# Patient Record
Sex: Female | Born: 1950 | Race: White | Hispanic: No | Marital: Married | Smoking: Never smoker
Health system: Southern US, Community
[De-identification: ages and names within clinical notes are randomized; demographics above are authoritative.]

## PROBLEM LIST (undated history)

## (undated) DIAGNOSIS — Z789 Other specified health status: Secondary | ICD-10-CM

## (undated) DIAGNOSIS — K219 Gastro-esophageal reflux disease without esophagitis: Secondary | ICD-10-CM

## (undated) DIAGNOSIS — H04123 Dry eye syndrome of bilateral lacrimal glands: Secondary | ICD-10-CM

## (undated) DIAGNOSIS — M1990 Unspecified osteoarthritis, unspecified site: Secondary | ICD-10-CM

## (undated) DIAGNOSIS — E785 Hyperlipidemia, unspecified: Secondary | ICD-10-CM

## (undated) DIAGNOSIS — B009 Herpesviral infection, unspecified: Secondary | ICD-10-CM

## (undated) DIAGNOSIS — I10 Essential (primary) hypertension: Secondary | ICD-10-CM

## (undated) DIAGNOSIS — Z8719 Personal history of other diseases of the digestive system: Secondary | ICD-10-CM

## (undated) HISTORY — DX: Essential (primary) hypertension: I10

## (undated) HISTORY — DX: Hyperlipidemia, unspecified: E78.5

---

## 1998-06-04 ENCOUNTER — Other Ambulatory Visit: Admission: RE | Admit: 1998-06-04 | Discharge: 1998-06-04 | Payer: Self-pay | Admitting: Obstetrics and Gynecology

## 2002-12-14 ENCOUNTER — Other Ambulatory Visit: Admission: RE | Admit: 2002-12-14 | Discharge: 2002-12-14 | Payer: Self-pay | Admitting: Gynecology

## 2002-12-31 ENCOUNTER — Encounter: Payer: Self-pay | Admitting: Emergency Medicine

## 2002-12-31 ENCOUNTER — Inpatient Hospital Stay (HOSPITAL_COMMUNITY): Admission: EM | Admit: 2002-12-31 | Discharge: 2003-01-01 | Payer: Self-pay | Admitting: *Deleted

## 2003-03-08 HISTORY — PX: HYSTEROSCOPY WITH D & C: SHX1775

## 2003-10-23 ENCOUNTER — Encounter (INDEPENDENT_AMBULATORY_CARE_PROVIDER_SITE_OTHER): Payer: Self-pay

## 2003-10-23 HISTORY — PX: OTHER SURGICAL HISTORY: SHX169

## 2003-10-24 ENCOUNTER — Observation Stay (HOSPITAL_COMMUNITY): Admission: AD | Admit: 2003-10-24 | Discharge: 2003-10-25 | Payer: Self-pay | Admitting: Gynecology

## 2003-12-08 HISTORY — PX: CHOLECYSTECTOMY: SHX55

## 2004-01-09 ENCOUNTER — Other Ambulatory Visit: Admission: RE | Admit: 2004-01-09 | Discharge: 2004-01-09 | Payer: Self-pay | Admitting: Gynecology

## 2004-09-19 ENCOUNTER — Encounter: Admission: RE | Admit: 2004-09-19 | Discharge: 2004-09-19 | Payer: Self-pay | Admitting: Gastroenterology

## 2005-01-16 ENCOUNTER — Other Ambulatory Visit: Admission: RE | Admit: 2005-01-16 | Discharge: 2005-01-16 | Payer: Self-pay | Admitting: Gynecology

## 2005-03-09 ENCOUNTER — Ambulatory Visit (HOSPITAL_COMMUNITY): Admission: RE | Admit: 2005-03-09 | Discharge: 2005-03-09 | Payer: Self-pay | Admitting: Gastroenterology

## 2005-08-29 ENCOUNTER — Encounter: Admission: RE | Admit: 2005-08-29 | Discharge: 2005-08-29 | Payer: Self-pay | Admitting: Orthopedic Surgery

## 2006-01-19 ENCOUNTER — Other Ambulatory Visit: Admission: RE | Admit: 2006-01-19 | Discharge: 2006-01-19 | Payer: Self-pay | Admitting: Gynecology

## 2006-03-23 ENCOUNTER — Encounter (INDEPENDENT_AMBULATORY_CARE_PROVIDER_SITE_OTHER): Payer: Self-pay | Admitting: Specialist

## 2006-03-23 ENCOUNTER — Ambulatory Visit (HOSPITAL_COMMUNITY): Admission: RE | Admit: 2006-03-23 | Discharge: 2006-03-24 | Payer: Self-pay | Admitting: Gynecology

## 2006-03-23 HISTORY — PX: VAGINAL HYSTERECTOMY: SUR661

## 2007-10-14 ENCOUNTER — Other Ambulatory Visit: Admission: RE | Admit: 2007-10-14 | Discharge: 2007-10-14 | Payer: Self-pay | Admitting: Gynecology

## 2008-12-19 ENCOUNTER — Ambulatory Visit: Payer: Self-pay | Admitting: Gynecology

## 2008-12-19 ENCOUNTER — Other Ambulatory Visit: Admission: RE | Admit: 2008-12-19 | Discharge: 2008-12-19 | Payer: Self-pay | Admitting: Gynecology

## 2008-12-19 ENCOUNTER — Encounter: Payer: Self-pay | Admitting: Gynecology

## 2009-07-23 ENCOUNTER — Inpatient Hospital Stay (HOSPITAL_COMMUNITY): Admission: RE | Admit: 2009-07-23 | Discharge: 2009-07-25 | Payer: Self-pay | Admitting: Orthopedic Surgery

## 2009-07-23 HISTORY — PX: TOTAL HIP ARTHROPLASTY: SHX124

## 2010-02-14 ENCOUNTER — Ambulatory Visit: Payer: Self-pay | Admitting: Gynecology

## 2010-02-14 ENCOUNTER — Other Ambulatory Visit: Admission: RE | Admit: 2010-02-14 | Discharge: 2010-02-14 | Payer: Self-pay | Admitting: Gynecology

## 2010-12-29 ENCOUNTER — Other Ambulatory Visit: Payer: Self-pay | Admitting: Gastroenterology

## 2011-03-12 ENCOUNTER — Other Ambulatory Visit: Payer: Self-pay | Admitting: Dermatology

## 2011-03-14 LAB — TYPE AND SCREEN
ABO/RH(D): A POS
Antibody Screen: NEGATIVE

## 2011-03-14 LAB — BASIC METABOLIC PANEL
BUN: 8 mg/dL (ref 6–23)
BUN: 8 mg/dL (ref 6–23)
CO2: 28 mEq/L (ref 19–32)
CO2: 29 mEq/L (ref 19–32)
Calcium: 8.2 mg/dL — ABNORMAL LOW (ref 8.4–10.5)
Calcium: 8.7 mg/dL (ref 8.4–10.5)
Chloride: 100 mEq/L (ref 96–112)
Chloride: 103 mEq/L (ref 96–112)
Creatinine, Ser: 0.68 mg/dL (ref 0.4–1.2)
Creatinine, Ser: 0.76 mg/dL (ref 0.4–1.2)
GFR calc Af Amer: >60 mL/min (ref >60)
GFR calc Af Amer: >60 mL/min (ref >60)
GFR calc non Af Amer: >60 mL/min (ref >60)
GFR calc non Af Amer: >60 mL/min (ref >60)
Glucose, Bld: 105 mg/dL — ABNORMAL HIGH (ref 70–99)
Glucose, Bld: 198 mg/dL — ABNORMAL HIGH (ref 70–99)
Potassium: 3.5 mEq/L (ref 3.5–5.1)
Potassium: 3.7 mEq/L (ref 3.5–5.1)
Sodium: 136 mEq/L (ref 135–145)
Sodium: 139 mEq/L (ref 135–145)

## 2011-03-14 LAB — CBC
HCT: 28 % — ABNORMAL LOW (ref 36.0–46.0)
HCT: 30.9 % — ABNORMAL LOW (ref 36.0–46.0)
Hemoglobin: 10.6 g/dL — ABNORMAL LOW (ref 12.0–15.0)
Hemoglobin: 9.9 g/dL — ABNORMAL LOW (ref 12.0–15.0)
MCHC: 34.2 g/dL (ref 30.0–36.0)
MCHC: 35.1 g/dL (ref 30.0–36.0)
MCV: 91.2 fL (ref 78.0–100.0)
MCV: 91.9 fL (ref 78.0–100.0)
Platelets: 163 10*3/uL (ref 150–400)
Platelets: 224 10*3/uL (ref 150–400)
RBC: 3.07 MIL/uL — ABNORMAL LOW (ref 3.87–5.11)
RBC: 3.36 MIL/uL — ABNORMAL LOW (ref 3.87–5.11)
RDW: 12.7 % (ref 11.5–15.5)
RDW: 13.2 % (ref 11.5–15.5)
WBC: 10 10*3/uL (ref 4.0–10.5)
WBC: 6.5 10*3/uL (ref 4.0–10.5)

## 2011-03-14 LAB — ABO/RH: ABO/RH(D): A POS

## 2011-03-15 LAB — DIFFERENTIAL
Basophils Absolute: 0 10*3/uL (ref 0.0–0.1)
Basophils Relative: 1 % (ref 0–1)
Eosinophils Absolute: 0.2 10*3/uL (ref 0.0–0.7)
Eosinophils Relative: 4 % (ref 0–5)
Lymphocytes Relative: 25 % (ref 12–46)
Lymphs Abs: 1.4 10*3/uL (ref 0.7–4.0)
Monocytes Absolute: 0.4 10*3/uL (ref 0.1–1.0)
Monocytes Relative: 7 % (ref 3–12)
Neutro Abs: 3.6 10*3/uL (ref 1.7–7.7)
Neutrophils Relative %: 64 % (ref 43–77)

## 2011-03-15 LAB — BASIC METABOLIC PANEL
BUN: 14 mg/dL (ref 6–23)
CO2: 32 mEq/L (ref 19–32)
Calcium: 9.5 mg/dL (ref 8.4–10.5)
Chloride: 101 mEq/L (ref 96–112)
Creatinine, Ser: 0.73 mg/dL (ref 0.4–1.2)
GFR calc Af Amer: >60 mL/min (ref >60)
GFR calc non Af Amer: >60 mL/min (ref >60)
Glucose, Bld: 69 mg/dL — ABNORMAL LOW (ref 70–99)
Potassium: 3.2 mEq/L — ABNORMAL LOW (ref 3.5–5.1)
Sodium: 140 mEq/L (ref 135–145)

## 2011-03-15 LAB — URINALYSIS, ROUTINE W REFLEX MICROSCOPIC
Bilirubin Urine: NEGATIVE
Glucose, UA: NEGATIVE mg/dL
Hgb urine dipstick: NEGATIVE
Ketones, ur: NEGATIVE mg/dL
Nitrite: NEGATIVE
Protein, ur: NEGATIVE mg/dL
Specific Gravity, Urine: 1.019 (ref 1.005–1.030)
Urobilinogen, UA: 0.2 mg/dL (ref 0.0–1.0)
pH: 6.5 (ref 5.0–8.0)

## 2011-03-15 LAB — CBC
HCT: 40.8 % (ref 36.0–46.0)
Hemoglobin: 13.9 g/dL (ref 12.0–15.0)
MCHC: 34.1 g/dL (ref 30.0–36.0)
MCV: 92.6 fL (ref 78.0–100.0)
Platelets: 249 10*3/uL (ref 150–400)
RBC: 4.4 MIL/uL (ref 3.87–5.11)
RDW: 12.6 % (ref 11.5–15.5)
WBC: 5.7 10*3/uL (ref 4.0–10.5)

## 2011-03-15 LAB — PROTIME-INR
INR: 1.1 (ref 0.00–1.49)
Prothrombin Time: 13.6 seconds (ref 11.6–15.2)

## 2011-03-15 LAB — APTT: aPTT: 29 seconds (ref 24–37)

## 2011-04-21 NOTE — Op Note (Signed)
[name], [name]                ACCOUNT NO.:  [id number]   MEDICAL RECORD NO.:  [id number]          PATIENT TYPE:  INP   LOCATION:  0007                         FACILITY:  [facility]   PHYSICIAN:  Madlyn Frankel. Charlann Boxer, M.D.  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   PROCEDURE:  Right total hip replacement.   COMPONENTS USED:  DePuy hip system size 48 Pinnacle cup and a single  cancellous screw, a  32 + 4 Marathon liner neutral and a two high Tri-  Lock stem with a 32 +5 Delta ceramic ball.   SURGEON:  Madlyn Frankel. Charlann Boxer, M.D.   ASSISTANT:  Yetta Glassman. Mann, PA   ANESTHESIA:  General.   BLOOD LOSS:  Approximately 350 to 400 mL.   DRAINS:  One Hemovac.   COMPLICATIONS:  None.   SPECIMEN:  None.   INDICATIONS:  Ms. Blackson is a 60 year old female who presented to the  office for evaluation of some hip pain, right greater than left.  Radiographs revealed mild dysplastic appearance with advanced bone-on-  bone arthritis.  Conservative measures were discussed but were not  going to provide any long-term relief so she wished to proceed with  arthroplasty.  We discussed the risks of infection, DVT, component  failure, dislocation, need for revision surgery  including dislocation.  The risks were discussed and reviewed.  Consent was obtained for the  benefit of pain relief.   PROCEDURE IN DETAIL:  The patient was brought to operative theater.  Once adequate anesthesia, preoperative antibiotics and Ancef  administered, the patient was positioned to the left lateral decubitus  position with the right-side up.  The right lower extremity was  prescrubbed, prepped and  draped in sterile fashion.  Time-out was  performed identifying the patient, planned procedure and extremity.  A  lateral based incision was made for posterior approach to the hip.  The  iliotibial band and gluteal fascia were incised posteriorly.  The short  external rotators were identified, taken down separate from the  posterior capsule and L capsulotomy was made at the border of the  gluteus minimus.   The hip was dislocated and neck osteotomy made based off anatomic  landmarks.   Severe degenerative changes noted at the femoral head.  Attention was  first directed to the femur.  Femoral canal was opened with a starting  drill and then hand reamed once to prevent fat emboli following  irrigation.  I began broaching with a starting broach and then broached  up to a size 1 which sat at the level of neck cut.  I packed off the  femur at this point and then attended to the acetabulum.  Following  labral debridement I reamed with a 42 reamer down to the medial wall and  then reamed up sequentially up to 47.  I stopped at that point because I  felt that we were thinning out the posterior wall,  I had adequate depth  and coverage and did not want to ream further.  A 48 Pinnacle cup was  chosen.  It was impacted and sat at approximately 35 degrees with 40  degrees of abduction and appeared to be anatomically positioned in  comparison to the transverse acetabular ligament and the anterior rim.  I did place a single cancellous screw into probably anterior cortical  ileum region with good purchase to support the scratch fit.  Trial  reduction was now carried out with a 32 +4 neutral liner.  When I placed  one broach down at the level of neck cut with a standard neck, there was  a lot of shucks.  I went ahead and removed the 1 and went up to a size 2  broach which sat proud to my neck cut and then trialed first a standard  and then high offset neck.  The high offset neck gave me  greater  sense  of stability with the hip.  The iliotibial band tension did not appear  to be excessive.   Given these findings I went ahead and removed all trial components.  The  32 +4 neutral Marathon liner was then impacted.  It sat flush with the  rim of the cup.   The final 2 high Tri-Lock stem was then impacted.  It sat about the  level where the broach was.  Given these parameters I retrialed with a  32 +1.5 and felt there was about  3 to 4 mm shuck and so I chose a 32 +5  ball.  With this during  my trials, there was only about a millimeter  shuck and  so I chose this as my final option.   The final 32 +5 Delta ceramic ball was impacted on the  clean and dry  trunnion and the hip was reduced.   The hip was irrigated throughout the case.  Again at this point I  reapproximated posterior capsular tissue to the superior leaflet using  #1 Vicryl.  The iliotibial band and gluteal fascia were then  reapproximated over top of a medium Hemovac drain.  The remainder of the  wound was closed with 2-0 Vicryl and running 4-0 Monocryl.  The hip  was  cleaned, dried and dressed sterilely with Steri-Strips and a Mepilex  dressing.  She was extubated and brought to the recovery room in stable  condition.      Madlyn Frankel Charlann Boxer, M.D.  Electronically Signed     MDO/MEDQ  D:  07/23/2009  T:  07/23/2009  Job:  [id number]

## 2011-04-21 NOTE — H&P (Signed)
NAMERENU, ASBY                ACCOUNT NO.:  [id number]   MEDICAL RECORD NO.:  [id number]          PATIENT TYPE:  INP   LOCATION:  NA                           FACILITY:  [facility]   PHYSICIAN:  Madlyn Frankel. Charlann Boxer, M.D.  DATE OF BIRTH:  [birth year]   DATE OF ADMISSION:  07/23/2009  DATE OF DISCHARGE:                              HISTORY & PHYSICAL   PROCEDURE:  A right total hip replacement.   CHIEF COMPLAINTS:  Right hip pain.   HISTORY OF PRESENT ILLNESS:  This is a 60 year old female with a history  of right hip pain secondary to osteoarthritis.  It has been refractory  to all conservative treatment.   PRIMARY CARE PHYSICIAN:  Pam Drown, M.D.   PAST MEDICAL HISTORY:  1. Osteoarthritis.  2. Hypertension.  3. Hypercholesterolemia.  4. Migraines.  5. Degenerative disk disease.   FAMILY HISTORY:  None.   SOCIAL HISTORY:  Married, nonsmoker.  Primary caregiver will be husband  in the home postoperatively.   DRUG ALLERGIES:  NO KNOWN DRUG ALLERGIES.   MEDICATIONS:  1. Metoprolol 100 mg p.o. daily.  2. Hydrochlorothiazide 12.5 mg p.o. daily.  3. Lipitor 20 mg p.o. daily.  4. Pantoprazole 40 mg p.o. daily.  5. Lunesta p.r.n.  6. Valtrex p.r.n.  7. Darvocet p.r.n.  8. Maxalt p.r.n.  9. Over-the-counter calcium 1000 mg p.o. daily.  10.Fish oil 1200 mg p.o. daily.  11.Multivitamin daily.  12.Ibuprofen p.r.n.Marland Kitchen   PAST SURGICAL HISTORY:  1. Bladder tuck in 1980.  2. Thyroid surgery in 2004.  3. Gallbladder surgery in 2005.  4. Hysterectomy in 2007.  5. SI injections.   REVIEW OF SYSTEMS:  GENITOURINARY:  She has increased urinary frequency.  MUSCULOSKELETAL:  Joint pain.  Otherwise, see HPI.   PHYSICAL EXAMINATION:  Pulse 72, respirations 16, blood pressure 174/96.  GENERAL:  Awake, alert and oriented.  HEENT:  Normocephalic.  NECK:  Supple.  No carotid bruits.  CHEST/LUNGS:  Clear to auscultation bilaterally.  BREASTS:  Deferred.  HEART: S1 and S2 distinct.  ABDOMEN:  Soft, nontender, bowel sounds present.  PELVIS:  Stable.  GENITOURINARY:  Deferred.  EXTREMITIES:  Right hip has decreased range of motion and pain with  weightbearing.  SKIN:  No cellulitis.  NEUROLOGIC:  Intact distal sensibilities.   LABORATORY DATA:  Labs, EKG, chest x-ray all pending presurgical  testing.   IMPRESSION:  Right hip osteoarthritis.   PLAN OF ACTION:  Right total hip replacement by Madlyn Frankel. Charlann Boxer, M.D. at  [facility] on July 23, 2009.  The risks and complications were  discussed.   Postoperative medication will be provided including aspirin for DVT  prophylaxis.     ______________________________  Yetta Glassman [address], [place]      Madlyn Frankel. Charlann Boxer, M.D.  Electronically Signed    BLM/MEDQ  D:  07/10/2009  T:  07/10/2009  Job:  [id number]   cc:   Pam Drown, M.D.  Fax: [phone]   Callie Fielding, M.D.  Fax: [phone]

## 2011-04-24 NOTE — Op Note (Signed)
NAME:  [name], [name]                          ACCOUNT NO.:  [id number]   MEDICAL RECORD NO.:  [id number]                   PATIENT TYPE:  AMB   LOCATION:  DFTL                                 FACILITY:  [facility]   PHYSICIAN:  Timothy P. Fontaine, M.D.           DATE OF BIRTH:  1951-05-11   DATE OF PROCEDURE:  10/23/2003  DATE OF DISCHARGE:                                 OPERATIVE REPORT   PREOPERATIVE DIAGNOSIS:  Hemoperitoneum, unknown etiology.   POSTOPERATIVE DIAGNOSES:  1. Ruptured leiomyomata.  2. Endometriosis.   PROCEDURES:  1. Diagnostic laparoscopy.  2. Exploratory laparotomy.  3. Abdominal myomectomy.  4. Fulguration of endometriosis.   SURGEON:  Timothy P. Fontaine, M.D.   ASSISTANT:  Scrub technician.   ANESTHESIA:  General endotracheal.   ESTIMATED BLOOD LOSS:  Hemoperitoneum at 1500-2000 mL, 50 mL operative loss.   COMPLICATIONS:  None.   FINDINGS:  A pedunculated 5 cm leiomyoma, fundal, with blown-out ruptured  area actively oozing and bleeding.  There were approximately 2000 mL of  hemoperitoneum filling the pelvis in a congealed mass as well as underneath  the right hemidiaphragm above the liver.  Uterus otherwise grossly normal to  inspection.  Anterior, posterior cul-de-sacs grossly normal, noting  hemoperitoneum limiting exam for small areas of endometriosis.  Left ovary  grossly normal, adherent to the left pelvic sidewall.  Left tube grossly  normal length and caliber, fimbriated end.  Right fallopian tube grossly  normal length and caliber, fimbriated end.  Right ovary with a classic  powder burn-type lesion, which was fulgurated.  Appendix grossly normal,  although distal portion buried and not visualized, no evidence of  inflammatory process.  Liver smooth, no abnormalities.  Gallbladder grossly  normal.   DESCRIPTION OF PROCEDURE:  The patient was taken to the operating room and  underwent general endotracheal anesthesia, was placed in the low  dorsal  lithotomy position, and received an abdominal, perineal, and vaginal  preparation with Hibiclens due to her iodine allergy.  A Foley catheter was  placed, EUA performed, subsequent Hulka tenaculum placed on the cervix.  The  patient was draped in the usual fashion.  A vertical infraumbilical incision  was made, the Veress needle placed, its position verified with water, and 2  L of carbon dioxide gas infused without difficulty.  The 10 mm laparoscopic  trocar was then placed without difficulty, its position verified visually.  A right of midline suprapubic 5 mm port was then placed under direct  visualization after transillumination for the vessels.  Examination of the  pelvis was then carried out, noting a large hemoperitoneum.  The congealed  blood covering the uterus and leiomyomata was evacuated and inspection of  the leiomyomata revealed an area on the superior aspect that was oozing and  after irrigation and better visualization, there was a blown-out portion  that was noted to be bleeding.  At this point, given the size  of the  leiomyomata and the active bleeding, the decision was made to proceed with a  laparotomy.  The instruments were removed.  A 0 Vicryl interrupted  subcutaneous fascial stitch was placed infraumbilically and the  infraumbilical port was closed using Dermabond skin adhesive.  The abdomen  was then sharply entered through a mini-Pfannenstiel incision achieving  adequate hemostasis at all levels.  The hemoperitoneum in the pelvis was  evacuated, the uterus elevated through the incision, and the active bleeding  was tamponaded.  A 0 Vicryl pursestring tie was then placed around the  pedunculated spot of the leiomyoma and tied snugly.  The leiomyoma was then  incised with electrocautery.  Two figure-of-eight interrupted sutures were  subsequently placed across the amputated stalk to assure secure hemostasis.  The pelvis was then irrigated and evacuated and  although due to the  hemoperitoneum and blood-staining, complete visualization was somewhat  limited.  Both adnexa were inspected and found to be hemostatic.  The right  ovary was noted to have a classic endometriotic powder burn-type lesion,  which was fulgurated.  The anterior incision was then irrigated.  Adequate  hemostasis was visualized, and the fascia was reapproximated using 0 Vicryl  in a running suture.  The subcutaneous tissues were irrigated and  electrocautery was used for hemostasis.  The 4-0 Vicryl running subcuticular  stitch was then used to close the skin and Steri-Strips with Benzoin  applied.  A sterile dressing was applied and the patient was taken to the  recovery room in gastric outlet obstruction, having tolerated the procedure  well.                                               Timothy P. Audie Box, M.D.    TPF/MEDQ  D:  10/23/2003  T:  10/24/2003  Job:  [id number]

## 2011-04-24 NOTE — Op Note (Signed)
[name], [name]                ACCOUNT NO.:  [id number]   MEDICAL RECORD NO.:  [id number]          PATIENT TYPE:  AMB   LOCATION:  SDC                           FACILITY:  [facility]   PHYSICIAN:  Timothy P. Fontaine, M.D.DATE OF BIRTH:  07/04/1951   DATE OF PROCEDURE:  03/23/2006  DATE OF DISCHARGE:                                 OPERATIVE REPORT   PREOPERATIVE DIAGNOSES:  Uterine prolapse, rectocele   POSTOPERATIVE DIAGNOSES:  Uterine prolapse, rectocele   PROCEDURES:  1.  Total vaginal hysterectomy.  2.  Bilateral salpingo-oophorectomy posterior colporrhaphies McCall      culdoplasty.   SURGEON:  Timothy P. Fontaine, M.D.   ASSISTANT:  Ivor Costa. Farrel Gobble, M.D.   ANESTHETIC:  General.   ESTIMATED BLOOD LOSS:  100 mL.   SPECIMEN:  Uterus, right and left fallopian tubes, right and left ovaries.   COMPLICATIONS:  None.   FINDINGS:  Uterus grossly normal size, shape, and contour with second-degree  prolapse; right and left ovaries grossly normal.  Postmenopausal in  appearance.  Right and left fallopian tubes, grossly normal.  Posterior  rectocele noted anterior vaginal wall with good support.  No evidence of cul-  de-sac disease or adhesions to limited inspection.   DESCRIPTION OF PROCEDURE:  The patient was taken to the operating room,  underwent general anesthesia, and was placed in a high dorsal lithotomy  position.  She received a perineal vaginal preparation with Hibiclens due to  a Betadine allergy.  The bladder was emptied with in-and-out Foley  catheterization; and the patient was draped in the usual fashion.  The  cervix was visualized with a surgical speculum.  Anterior lip grasped with a  tenaculum; and the cervix was circumferentially injected with epinephrine-  lidocaine mixture.  A total of 9 mL were used.  The cervical mucosa was then  circumferentially incised, and the paracervical planes developed.   The posterior cul-de-sac was then sharply entered without  difficulty and a  long-weighted speculum placed; and the right and left uterosacral ligaments  were identified, clamped, cut, and ligated using #0 Vicryl suture, and  tagged for future reference.  The anterior cul-de-sac was sharply developed  and subsequently entered without difficulty.  Uterus was then progressively  freed from its attachments through clamping, cutting, and ligating of the  paracervical parametrial tissues using #0 Vicryl suture.  Uterus was then  delivered through the vagina and the uterine ovarian pedicles were clamped  bilaterally and the uterus was excised.  Subsequently the right  infundibulopelvic ligaments and vessels were identified, clamped, and cut;  the ovary and fallopian tube removed, and the pedicle secured with a double  ligation using #0 Vicryl suture; first in a simple stitch, followed by  suture ligature.  A similar procedure was carried out on the other side.  The intestines were then packed from the operative field using a tagged tail  sponge; and the posterior vaginal cuff was run from uterosacral ligament to  uterosacral ligament using #0 Vicryl suture in a running interlocking  stitch.  A McCall culdoplasty stitch was placed using 2-0 Vicryl  and tagged  for future tying.  The bowel packing was removed.  The pelvis irrigated,  adequate hemostasis visualized at the pedicles; and subsequently the vagina  was closed anterior-to-posterior using #0 Vicryl suture in interrupted  figure-of-eight stitch.  McCall's culdoplasty stitch was then secured.   Attention was then turned to the posterior colporrhaphy.  The posterior  fourchette was sharply incised transversely, identified with Allis clamps  and subsequently the vaginal mucosa was incised anterior-to-posterior in the  midline in progressive fashion identifying with Allis clamps along the cut  mucosal surface.  The perirectal planes were then sharply developed into the  perirectal spaces bilaterally  and the rectocele was reduced using 2-0 Vicryl  in interrupted stitch in an imbricating fashion.  The excess vaginal mucosa  was then excised, and the vaginal mucosa was closed using 2-0 Vicryl in a  running interlocking stitch; tagging the underlying tissues to close the  dead space intermittently.  The vagina was irrigated; and adequate  hemostasis was visualized.  A Foley catheter was placed.  Clear yellow free-  flowing urine was noted.  A rectal exam was performed to assure rectal  integrity, and that no sutures were within the rectum; and the exam was  normal.  A vaginal packing with Estrace cream was then placed.  The patient  placed in a supine position, awakened without difficulty, and taken to the  recovery room in good condition having tolerated the procedure well.      Timothy P. Fontaine, M.D.  Electronically Signed     TPF/MEDQ  D:  03/23/2006  T:  03/23/2006  Job:  [id number]

## 2011-04-24 NOTE — H&P (Signed)
NAME:  [name], [name]                          ACCOUNT NO.:  [id number]   MEDICAL RECORD NO.:  [id number]                   PATIENT TYPE:  INP   LOCATION:  1825                                 FACILITY:  [facility]   PHYSICIAN:  Candyce Churn, M.D.          DATE OF BIRTH:  15-Jul-1951   DATE OF ADMISSION:  12/31/2002  DATE OF DISCHARGE:                                HISTORY & PHYSICAL   CHIEF COMPLAINT:  Chest pressure.   HISTORY OF PRESENT ILLNESS:  The patient is a pleasant 60 year old female  with 1) history of borderline elevation in cholesterol, 2) migraine  headaches, 3) muscle cramps.  She presents with chest pressure to her back  for about 20 hours, had nausea and vomiting in the ER after sublingual  nitroglycerin given.  Chest pressure and pain resolved with the sublingual  nitroglycerin, however.  She has been having PVCs occasionally while in the  ER.  She has been able to walk up to 2 miles at a time up until late  December but has not really exercised since.  She took Naprosyn the day  before yesterday for muscle cramps and also took Maxalt 10 mg yesterday for  menstrual migraines.  She is currently having her period.  She dropped her  blood pressure to 96, and her heart rate went into the 40s, and she  developed nausea after sublingual nitroglycerin in the emergency room.  She  was very diaphoretic. Again, chest pain was relieved with that incident.  Initial CPK and EKG are normal except for occasional PVCs.  She is admitted  now to rule out any myocardial injury or unstable angina.   MEDICATIONS:  1. Maxalt 10 mg p.r.n.  2. Naprosyn, questionable dose, p.r.n. menstrual cramps.   ALLERGIES:  None.   PAST MEDICAL HISTORY:  As above.   PAST SURGICAL HISTORY:  Bladder surgery x 2.   GYN HISTORY:  Gravida 2, para 2.   FAMILY HISTORY:  Negative for coronary artery disease.  Mother and father  are in their 12s and are alive and well.   REVIEW OF SYSTEMS:  No  melena, diarrhea, headache, or fatty food  intolerance.   PHYSICAL EXAMINATION:  HEENT:  Benign. Atraumatic, normocephalic.  NECK:  Supple without JVD, no thyromegaly.  VITAL SIGNS:  Blood pressure 149/66, pulse 77 and regular, respirations 16  and easy.  CHEST:  Clear.  CARDIAC:  Regular rate and rhythm with 2/6 systolic ejection murmur over the  aortic area.  ABDOMEN: Soft, nontender.  Normal bowel sounds.  No organomegaly.  EXTREMITIES:  Without cyanosis, clubbing, or edema.  NEUROLOGIC:  Nonfocal.   LABORATORY DATA:  EKG shows normal sinus rhythm, normal EKG.  Occasional PVC  on monitor.   Sodium 139, potassium 3.7, chloride 108, bicarb 25, BUN 15, creatinine not  checked.  Glucose 106.  CPK 82, CK-MB 1.9.  Hemoglobin 15.  PT 12.6 seconds,  INR  0.9.   Chest x-ray: No active disease.   ASSESSMENT/PLAN:  A 60 year old female with chest pain responsive to  sublingual nitroglycerin.  Blood pressure is mildly elevated.  Only risk  factor for heart disease is history of increased cholesterol.  Could be  reflux esophageal spasm: pain went to her back.  Could be gallstones.  Doubt  pulmonary embolus, doubt infection.   PLAN:  1. Admit to monitored.  2. Check serial CPKs.  3. EKG with pain.  4. Watch for decreased blood pressure on sublingual nitroglycerin.  5. Lovenox subcutaneously.  6. IV Protonix.  7. Follow blood pressures.   If CPK is negative x 3, likelihood of significant coronary artery disease is  low with prolonged chest pressure and negative enzymes and EKG.                                                 Candyce Churn, M.D.    RNG/MEDQ  D:  12/31/2002  T:  12/31/2002  Job:  [id number]   cc:   Deirdre Peer. Polite, M.D.  [address]  [place], [place] [address]  Fax: [phone]   [facility]

## 2011-04-24 NOTE — Discharge Summary (Signed)
   NAME:  [name], [name]                          ACCOUNT NO.:  [id number]   MEDICAL RECORD NO.:  [id number]                   PATIENT TYPE:  INP   LOCATION:  5501                                 FACILITY:  [facility]   PHYSICIAN:  Deirdre Peer. Polite, M.D.              DATE OF BIRTH:  03/22/1951   DATE OF ADMISSION:  12/31/2002  DATE OF DISCHARGE:  01/01/2003                                 DISCHARGE SUMMARY   ADDENDUM:  This is an addendum to report #[id number].  The patient's Toprol  dosage at discharge will be 50 mg daily rather than 25 mg daily, and vital  signs at time of discharge:  Temperature 98.3, blood pressure 153/91, heart  rate 74, respirations 18, room air saturations are 97%.     Ellender Hose. Virl Son. Polite, M.D.    SMD/MEDQ  D:  01/01/2003  T:  01/01/2003  Job:  [id number]   cc:   [facility], [facility]   Meade Maw, M.D.  [address]  [place]  [place] [address]  Fax: [phone]

## 2011-04-24 NOTE — Op Note (Signed)
NAME:  [name], KHALID                ACCOUNT NO.:  [id number]   MEDICAL RECORD NO.:  [id number]          PATIENT TYPE:  AMB   LOCATION:  ENDO                         FACILITY:  [facility]   PHYSICIAN:  Graylin Shiver, M.D.   DATE OF BIRTH:  [birth year]   DATE OF PROCEDURE:  03/09/2005  DATE OF DISCHARGE:                                 OPERATIVE REPORT   INDICATIONS:  Right upper quadrant abdominal pain etiology unclear. The  patient is status post cholecystectomy.   Informed consent was obtained after explanation of the risks of bleeding,  infection and perforation.   PREMEDICATION:  Fentanyl 40 mcg IV, Versed 5 milligrams IV.   PROCEDURE:  With the patient in the left lateral decubitus position, the  Olympus gastroscope was inserted into the oropharynx and passed into the  esophagus. It was advanced down the esophagus and into the stomach and into  the duodenum. The second portion and bulb of the duodenum were normal. The  stomach had a normal-appearing mucosa. Biopsy for CLO-test was obtained to  look for evidence of H pylori. Lesions were seen in the fundus or cardia on  retroflexion. The esophagus had normal-appearing mucosa. The esophagogastric  junction was at 35 cm. She tolerated the procedure well without  complications.   IMPRESSION:  Normal esophagogastroduodenoscopy.      SFG/MEDQ  D:  03/09/2005  T:  03/09/2005  Job:  [id number]   cc:   Pam Drown, M.D.  [address]  [place]  [place] [address]  Fax: [phone]

## 2011-04-24 NOTE — Consult Note (Signed)
NAME:  ANZAL, BARTNICK NO.:  [id number]   MEDICAL RECORD NO.:  [id number]                   PATIENT TYPE:   LOCATION:                                       FACILITY:   PHYSICIAN:  Meade Maw, M.D.                 DATE OF BIRTH:  [birth year]   DATE OF CONSULTATION:  DATE OF DISCHARGE:                                   CONSULTATION   HISTORY OF PRESENT ILLNESS:  Ms. Witzke is a pleasant 60 year old female who  has a past medical history of borderline dyslipidemia, migraines headaches,  and muscle cramps. She presents to the emergency room with complaints of  chest pressure, radiating through her back for approximately 20 hours. This  is associated with nausea and vomiting in the emergency room after a SL  Nitroglycerin was given. The chest pain/pressure subsequently resolved. She  has continued with her exercise program up until late December, walking up  to 2 miles per day and has not really exercised since then. She has been  taking Naprosyn for muscle cramps and Maxalt 10 mg for menstrual migraines.  She currently is having her period. She denies palpitations,  tachyarrhythmia, orthopnea, or PND. She has had no prior chest pain.   ALLERGIES:  IODINE (rash).   ADMISSION MEDICATIONS:  Include Maxalt 10 mg daily and Naprosyn as needed.   CURRENT MEDICATIONS:  Include Phenergan as needed, Lovenox subcutaneous  every twelve hours, Protonix 30 mg each day, SL Nitroglycerin as needed,  Toprol 25 mg daily, aspirin 81 mg daily, Protonix 30 mg daily.   PAST SURGICAL HISTORY:  Bladder surgery times two.   GYN HISTORY:  Gravida II, Para II.   FAMILY HISTORY:  Negative for coronary artery disease. Mother and father are  in their 39's and are alive and well.   REVIEW OF SYSTEMS:  Negative. There has been no change in bowel or bladder  habits. No presyncope or syncope noted.   SOCIAL HISTORY:  She is married. Works as a Event organiser. Attends to her  children.   PHYSICAL EXAMINATION:  GENERAL: A middle aged female, pleasant, no acute  distress.  VITAL SIGNS: Systolic pressure has ranged from 140 to 160 with diastolic  pressures up to 110. Heart rates are 80's to 90's with respiratory rate of  18. O2 sat is 97% on room air.  HEENT: Unremarkable. Good carotid upstroke. No carotid bruit.  NECK: No neck vein distention.  LUNGS: Breath sounds are equal and clear to auscultation and without  accessory muscles.  CARDIAC: Regular rate and rhythm. Normal S1 and normal S2. No murmur, rub,  or gallop.  ABDOMEN: Soft, benign, and nontender. No unusual bruits or pulsations are  noted.  EXTREMITIES: No clubbing, cyanosis, or edema.  NEURO: Nonfocal.   DIAGNOSTIC IMPRESSION:  ECG reveals a sinus rhythm with normal ECG. There is  no premature ventricular contraction. Telemetry  reveals a sinus rhythm.  Serial cardiac enzymes have been negative. Serial troponin's have been  negative. TSH is 8.3.   IMPRESSION:  1. Atypical chest pain in a 60 year old female who has been taking Naprosyn     as needed for headaches. Chest pain may actually be related to GI     etiology. A stress Cardiolite will be scheduled as an outpatient in that     the patient has had no recurrent chest pain. Serial cardiac enzymes and     EKG have been negative.  2. Hypertension. Would not continue both Atenolol and Toprol. Atenolol has     been discontinued. Toprol has been increased to 50 mg daily. May need to     add a diuretic for better blood pressure control.  3. Hypertension in the emergency room. Suspect that this was a vagal     reaction to her SL Nitroglycerin.   Thank you for allowing me to participate in the care of this patient. I will  discuss this patient with you further.                                                Meade Maw, M.D.    HP/MEDQ  D:  01/01/2003  T:  01/01/2003  Job:  [id number]

## 2011-04-24 NOTE — Discharge Summary (Signed)
NAME:  [name], [name]                          ACCOUNT NO.:  [id number]   MEDICAL RECORD NO.:  [id number]                   PATIENT TYPE:  INP   LOCATION:  5501                                 FACILITY:  [facility]   PHYSICIAN:  Deirdre Peer. Watson, M.D.              DATE OF BIRTH:  1951-03-27   DATE OF ADMISSION:  12/31/2002  DATE OF DISCHARGE:  01/01/2003                                 DISCHARGE SUMMARY   PRIMARY CARE:  [facility].   DISCHARGE DIAGNOSES:  1. Chest pressure, resolved.  2. Ruled out myocardial infarction; no evidence of cardiac ischemia.  3. Hyperlipidemia.  4. Migraine headaches.   DISCHARGE MEDICATIONS:  1. Toprol  XL 25 mg p.o. q.d.  2. Enteric-coated aspirin 81 mg p.o. q.d.  3. Protonix 40 mg p.o. q.d.   ALLERGIES:  IODINE, WHICH CAUSES A RASH.   PROCEDURE:  None.   HISTORY OF PRESENT ILLNESS:  A pleasant 60 year old female with a history of  borderline elevation in cholesterol, migraine headaches and muscle cramps.  She presented with chest pressure to her back for about 20 hours, had nausea  and vomiting in the ER after sublingual nitroglycerin given. Chest pressure  and pain resolved with the sublingual nitroglycerin however.  She had been  having PVC's occasionally while in the ER. She has been able to walk up to 2  miles at a time until late December, but has not really exercised since. She  took Naprosyn the day before yesterday for muscle cramps and also took  Maxalt 10 mg yesterday 10 mg for menstrual migraines. She is currently  having her period.  She dropped her blood pressure to 96 and her heart rate  went into the 40's. She developed nausea after sublingual nitroglycerin in  the emergency room, and she was very diaphoretic.  Again, chest pain  relieved without incident.  Initial CPK and EKG's were normal except for  occasional PVC's.  The patient was admitted  to rule out any myocardial  injury or unstable  angina.   HOSPITAL COURSE:  The patient was admitted to telemetry unit, provided with  IV hydration, Lovenox at 1 mg/kg q.12h., stress ulcer prophylaxis with  Protonix.  Repeat EKG revealed normal sinus rhythm without any ectopy or  acute ST-T wave changes.  Cardiac enzymes x3 were negative.  The patient's  TSH is 3.369. Liver functions normal, amylase normal, PT/INR normal.  Patient was evaluated by Dr. Fraser Din of [facility] and found to be  satisfactory for outpatient stress echo, which will be scheduled by Dr.  Lindell Spar office.  He will call the patient at home.  There were no  restrictions on her activities. She was encouraged to follow a high fiber,  low cholesterol, low fat diet, and to follow up with Dr. Fraser Din as noted.  There is no requirement to follow up with her primary M.D.  DISCHARGE LABORATORY DATA:  Sodium 139, potassium 3.7, glucose 106, BUN 15.  Hemoglobin 15.0, hematocrit 44.0, creatinine 0.8.   CONSULTATIONS:  Meade Maw, M.D., [facility].   CONDITION ON DISCHARGE:  Good, pain free.   DISPOSITION:  The patient was discharged to home.  Follow up is as noted  above with Dr. Fraser Din.  No further follow up is required at this time.      [name], M.D.    SMD/MEDQ  D:  01/01/2003  T:  01/01/2003  Job:  [id number]   cc:   Sadie Haber [facility]   Meade Maw, M.D.  [address]  [place]  [place] [address]  Fax: [phone]

## 2011-04-24 NOTE — Discharge Summary (Signed)
[name], [name]                ACCOUNT NO.:  [id number]   MEDICAL RECORD NO.:  [id number]          PATIENT TYPE:  INP   LOCATION:  1605                         FACILITY:  [facility]   PHYSICIAN:  Madlyn Frankel. Charlann Boxer, M.D.  DATE OF BIRTH:  [birth year]   DATE OF ADMISSION:  07/23/2009  DATE OF DISCHARGE:  07/25/2009                               DISCHARGE SUMMARY   ADMITTING DIAGNOSES:  1. Osteoarthritis.  2. Hypertension.  3. Hypercholesterolemia.  4. Migraines.  5. Degenerative disk disease.   DISCHARGE DIAGNOSES:  1. Osteoarthritis.  2. Hypertension.  3. Hypercholesteremia.  4. Migraines.  5. Degenerative disk disease.  6. Postoperative hypokalemia.   HISTORY OF PRESENT ILLNESS:  A 60 year old female with a history of  right hip pain secondary to osteoarthritis.   CONSULTS:  None.   PROCEDURE:  A right total hip replacement by surgeon Dr. Durene Romans.  Assistant [employer] PA-C.   LABORATORY DATA:  CBC:  White blood cell final count showed white blood  cells 6.5, hemoglobin 9.9, hematocrit 28, platelets 163.  Metabolic  sodium 161, potassium 3.5, BUN 8, creatinine 0.68, glucose 105.   HOSPITAL COURSE:  The patient admitted to the hospital.  Underwent right  total hip replacement.  Admitted to the orthopedic floor.  Her stay was  unremarkable.  She remained hemodynamically, orthopedically stable  throughout her course of stay.  Dressing was checked on day #2 with no  significant drainage from the wound.  She remained neurovascularly  intact right lower extremity throughout.  She was weightbearing as  tolerated.  Made good progress with physical therapy.  By day #2 had met  all criteria for discharge home.   DISCHARGE DISPOSITION:  Discharged home in stable and improved  condition.   DISCHARGE DIET:  Heart-healthy.   DISCHARGE WOUND CARE:  Keep dry.   DISCHARGE PHYSICAL THERAPY:  Weightbearing as tolerated with use of a  rolling walker.   DISCHARGE MEDICATIONS:  1.  Aspirin 325 mg p.o. b.i.d.  2. Robaxin 500 mg p.o. q.6.  3. Iron 325 mg p.o. t.i.d.  4. Colace 100 mg p.o. b.i.d.  5. MiraLax 17 g p.o. daily.  6. Norco 5/325 one to two p.o. q.4-6 p.r.n. pain.  7. Lipitor 20 mg p.o. daily.  8. HCTZ 12.5 mg p.o. daily.  9. Pantoprazole 40 mg p.o. daily.  10.Metoprolol 100 mg p.o. daily.  11.__________  p.o. q.h.s. p.r.n.  12.Valtrex 1 g tablets, take 1/2 tab p.r.n.  13.Tramadol hold.  14.Calcium 1000 mg p.o. daily.  15.Fish oil 1200 mg p.o. daily.  16.Multivitamin daily.  17.Ibuprofen p.r.n.   DISCHARGE FOLLOW-UP:  Follow up with Dr. Charlann Boxer at phone number [phone]  in 2 weeks for wound check.     ______________________________  Yetta Glassman. [address], [place]      Madlyn Frankel. Charlann Boxer, M.D.  Electronically Signed    BLM/MEDQ  D:  07/30/2009  T:  07/30/2009  Job:  [id number]   cc:   Pam Drown, M.D.  Fax: [phone]   Callie Fielding, M.D.  Fax: [phone]

## 2011-04-24 NOTE — H&P (Signed)
[name], [name]                ACCOUNT NO.:  [id number]   MEDICAL RECORD NO.:  [id number]          PATIENT TYPE:  AMB   LOCATION:  SDC                           [facility]ACILITY:  [facility]   PHYSICIAN:  Timothy P. Fontaine, M.D.DATE OF BIRTH:  November 08, 1951   DATE OF ADMISSION:  03/23/2006  DATE OF DISCHARGE:                                HISTORY & PHYSICAL   CHIEF COMPLAINT:  Uterine prolapse, rectocele.   HISTORY OF PRESENT ILLNESS:  60 year old G2, P2 female with second-degree  uterine prolapse, second-degree rectocele for total vaginal hysterectomy,  bilateral salpingo-oophorectomy, posterior colporrhaphy, possible  laparoscopic-assisted, possible exploratory laparotomy.   PAST MEDICAL HISTORY:  1.  Significant for hypertension.  2.  GERD.  3.  Migraine headaches.  4.  Elevated cholesterol.   PAST SURGICAL HISTORY:  1.  Bladder suspension.  2.  Bladder repair secondary to bladder suspension complication.  3.  Cholecystectomy.  4.  Removal of hemorrhagic leiomyomata.   CURRENT MEDICATIONS:  1.  Toprol XL 100 mg p.o. daily.  2.  Protonix 40 mg p.o. daily.  3.  Hydrochlorothiazide 12.5 mg daily.  4.  Multivitamin.  5.  Calcium.  6.  Lipitor 20 mg daily.   ALLERGIES:  No medications.   REVIEW OF SYSTEMS:  Noncontributory.   FAMILY HISTORY:  Noncontributory.   SOCIAL HISTORY:  Noncontributory.   ADMISSION PHYSICAL EXAMINATION:  VITAL SIGNS:  Afebrile.  Vital signs are  stable.  HEENT: Normal.  LUNGS:  Clear.  CARDIAC:  Regular rate without rubs, murmurs, or gallops.  ABDOMEN:  Benign.  PELVIC:  Second-degree uterine prolapse with cervix at the introitus.  Uterus grossly normal in size, shape, and contour.  Adnexa without masses or  tenderness.  Rectovaginal examination confirms the second-degree rectocele.   ASSESSMENT AND PLAN:  60 year old female with worsening uterine prolapse now  with cervix protruding from introitus, second-degree rectocele for total  vaginal  hysterectomy, bilateral salpingo-oophorectomy, posterior  colporrhaphy, possible laparoscopic-assisted, possible exploratory  laparotomy.  I reviewed the proposed surgery with the patient and her  husband to include the expected intraoperative, postoperative courses.  The  issue of ovarian conservation was reviewed with her and the issues of  keeping her ovaries versus removing them were discussed and the patient who  is now postmenopausal wants both ovaries removed.  The patient also states  that if we are unable to obtain this vaginally that she would want a  separate incision such as a laparoscopic incision to achieve removal of her  ovaries.  Sexuality following hysterectomy was reviewed and the potential  for orgasmic dysfunction as well as persistent dyspareunia following the  procedure was discussed, understood, and accepted.  The patient understands  particularly due to her prior surgeries that we may encounter adhesions or  scar tissue as well as normal complications with surgery that may  necessitate abandoning the vaginal approach either going to a laparoscopic-  assisted approach or an exploratory laparotomy approach to complete her  surgery and she understands and accepts this.  The risks of surgery in  general were reviewed with her to include the  risks of infection both  internal requiring prolonged antibiotics as well as incisional, cuff  abscess, rectovaginal septal abscess all requiring opening and draining of  incisions, closure by secondary intention all of which is understood and  accepted.  The risks of hemorrhage necessitating transfusion and the risks  of transfusion were reviewed to include transfusion reaction, hepatitis,  HIV, Mad Cow disease and other unknown entities.  The risks of inadvertent  injury to internal organs including bowel, bladder, ureters, vessels, and  nerves necessitating major exploratory reparative surgeries and future  reparative surgeries  including ostomy formation was all reviewed,  understood, and accepted.  Patient understands she has had prior bladder  surgery and her bladder is at an increased risk for damage during the  procedure as well as the posterior colporrhaphy increasing the risk of  rectal injury and repair was all discussed, understood, and accepted.  Patient has seen Dr. Gweneth Dimitri for a preoperative clearance who feels  she is routine cardiac, respiratory risk for surgery.  Patient's questions  were answered to her satisfaction and she is ready to proceed with surgery.      Timothy P. Fontaine, M.D.  Electronically Signed     TPF/MEDQ  D:  03/16/2006  T:  03/16/2006  Job:  [id number]

## 2011-04-24 NOTE — Discharge Summary (Signed)
NAME:  [name], [name]                          ACCOUNT NO.:  [id number]   MEDICAL RECORD NO.:  [id number]                   PATIENT TYPE:  OBV   LOCATION:  9326                                 FACILITY:  [facility]   PHYSICIAN:  Timothy P. Fontaine, M.D.           DATE OF BIRTH:  1951-03-13   DATE OF ADMISSION:  10/23/2003  DATE OF DISCHARGE:  10/25/2003                                 DISCHARGE SUMMARY   DISCHARGE DIAGNOSES:  1. Acute abdomen.  2. Hemoperitoneum of unknown etiology preoperatively.  3. Postoperatively ruptured leiomyomata, endometriosis status post     diagnostic laparoscopy, exploratory laparotomy, abdominal myomectomy, and     fulguration of endometriosis by Timothy P. Fontaine, M.D. October 23, 2003.   HISTORY:  A 60 year old female gravida 2, para 2 with an LMP of October 17, 2003.  Presented with increased lower abdominal pain, right greater than  left.  Was increased the date of admission.  Denied nausea, vomiting, lower  GI symptoms.  The patient examined.  Abdomen soft, tender more so right  versus left with positive rebound and guarding.  Diagnosis of acute abdomen  was made.  Sonogram revealed fluid in the cul-de-sac right lower quadrant,  uterine fibroids, ovaries within normal limits.  CT scan was inconclusive  with a preoperative diagnosis of hemoperitoneum.  Therefore, the patient was  admitted.   HOSPITAL COURSE:  On October 23, 2003 patient was admitted with acute  abdomen, hemoperitoneum, and underwent a diagnostic laparoscopy, exploratory  laparotomy, abdominal myomectomy, and fulguration of endometriosis by  Timothy P. Fontaine, M.D.  The postoperative diagnosis was ruptured  leiomyomata and endometriosis.  There was noted to be a pedunculated 5 cm  leiomyomatous fundus with blown out ruptured area, active oozing and  bleeding with approximately 2000 mL hemoperitoneum in the pelvis and a  congealed mass as well that is underneath the right  hemidiaphragm above the  liver.  There were noted to be areas of endometriosis as well.  Postoperatively patient remained afebrile, stable condition and was thought  to be in satisfactory condition for discharge to home on October 26, 2003.   ACCESSORY CLINICAL FINDINGS:  Laboratories:  On October 23, 2003,  preoperatively MCV is 13.9.  October 24, 2003 postoperatively hemoglobin  8.5.  Pregnancy test serum on October 23, 2003 was negative.  CMET revealed  ALT of 49, was slightly elevated.   DISPOSITION:  The patient is discharged to home.  Follow up in two weeks.  Given prescription for Tylox p.r.n. pain.     Susa Loffler, P.A.                    Timothy P. Audie Box, M.D.    TSG/MEDQ  D:  11/19/2003  T:  11/19/2003  Job:  [id number]

## 2011-04-24 NOTE — Op Note (Signed)
NAME:  [name], [name]                ACCOUNT NO.:  [id number]   MEDICAL RECORD NO.:  [id number]          PATIENT TYPE:  AMB   LOCATION:  ENDO                         FACILITY:  [facility]   PHYSICIAN:  Graylin Shiver, M.D.   DATE OF BIRTH:  08-05-1951   DATE OF PROCEDURE:  03/09/2005  DATE OF DISCHARGE:                                 OPERATIVE REPORT   INDICATIONS FOR PROCEDURE:  Screening.   Informed consent was obtained after explanation of the risks of bleeding,  infection and perforation.   PREMEDICATION:  The procedure was done after an EGD with an additional 60  mcg of fentanyl and 4 milligrams of Versed given IV.   PROCEDURE:  With the patient in the left lateral decubitus position, a  rectal exam was performed. No masses were felt. The Olympus colonoscope was  inserted into the rectum and advanced around the colon to the cecum. Cecal  landmarks were identified. The cecum and descending colon were normal. The  transverse colon normal. The descending colon, sigmoid and rectum were  normal. She tolerated the procedure well without complications.   IMPRESSION:  Normal colonoscopy to the cecum.   I would recommend a follow-up screening colonoscopy again in 10 years.      SFG/MEDQ  D:  03/09/2005  T:  03/09/2005  Job:  [id number]   cc:   Pam Drown, M.D.  [address]  [place]  [place] [address]  Fax: [phone]

## 2011-08-05 ENCOUNTER — Other Ambulatory Visit: Payer: Self-pay | Admitting: Family Medicine

## 2011-08-05 DIAGNOSIS — R197 Diarrhea, unspecified: Secondary | ICD-10-CM

## 2011-08-05 DIAGNOSIS — I10 Essential (primary) hypertension: Secondary | ICD-10-CM

## 2011-08-06 ENCOUNTER — Ambulatory Visit
Admission: RE | Admit: 2011-08-06 | Discharge: 2011-08-06 | Disposition: A | Discharge status: Home / Self Care | Payer: BC Managed Care – PPO | Source: Ambulatory Visit | Attending: Family Medicine | Admitting: Family Medicine

## 2011-08-06 DIAGNOSIS — R197 Diarrhea, unspecified: Secondary | ICD-10-CM

## 2011-08-06 DIAGNOSIS — I10 Essential (primary) hypertension: Secondary | ICD-10-CM

## 2011-08-06 MED ORDER — IOHEXOL 300 MG/ML  SOLN
100.0000 mL | Freq: Once | INTRAMUSCULAR | Status: AC | PRN
  Administered 2011-08-06: 100 mL via INTRAVENOUS

## 2011-08-31 ENCOUNTER — Other Ambulatory Visit: Payer: Self-pay | Admitting: Gynecology

## 2011-08-31 DIAGNOSIS — E785 Hyperlipidemia, unspecified: Secondary | ICD-10-CM | POA: Insufficient documentation

## 2011-08-31 DIAGNOSIS — I10 Essential (primary) hypertension: Secondary | ICD-10-CM | POA: Insufficient documentation

## 2011-09-08 ENCOUNTER — Other Ambulatory Visit: Payer: Self-pay | Admitting: *Deleted

## 2011-09-08 MED ORDER — VALACYCLOVIR HCL 1 G PO TABS: 500.0000 mg | ORAL_TABLET | Freq: Two times a day (BID) | ORAL | Status: DC

## 2011-09-08 NOTE — Telephone Encounter (Signed)
Pt has AEX 10/05/11 Kw

## 2011-10-05 ENCOUNTER — Encounter: Payer: Self-pay | Admitting: Gynecology

## 2011-10-05 ENCOUNTER — Ambulatory Visit (INDEPENDENT_AMBULATORY_CARE_PROVIDER_SITE_OTHER): Payer: BC Managed Care – PPO | Admitting: Gynecology

## 2011-10-05 VITALS — BP 130/70 | Ht 61.0 in | Wt 131.5 lb

## 2011-10-05 DIAGNOSIS — K529 Noninfective gastroenteritis and colitis, unspecified: Secondary | ICD-10-CM | POA: Insufficient documentation

## 2011-10-05 DIAGNOSIS — Z01419 Encounter for gynecological examination (general) (routine) without abnormal findings: Secondary | ICD-10-CM

## 2011-10-05 DIAGNOSIS — K469 Unspecified abdominal hernia without obstruction or gangrene: Secondary | ICD-10-CM

## 2011-10-05 DIAGNOSIS — A6000 Herpesviral infection of urogenital system, unspecified: Secondary | ICD-10-CM

## 2011-10-05 DIAGNOSIS — Z780 Asymptomatic menopausal state: Secondary | ICD-10-CM

## 2011-10-05 MED ORDER — VALACYCLOVIR HCL 1 G PO TABS: 1000.0000 mg | ORAL_TABLET | Freq: Two times a day (BID) | ORAL | Status: DC

## 2011-10-05 NOTE — Progress Notes (Signed)
[name] May 27, 1951 [id number]        60 y.o.  for annual exam.  Doing well no complaints.  Past medical history,surgical history, medications, allergies, family history and social history were all reviewed and documented in the EPIC chart. ROS:  Was performed and pertinent positives and negatives are included in the history.  Exam: chaperone present Filed Vitals:   10/05/11 1504  BP: 130/70   General appearance  Normal Skin grossly normal Head/Neck normal with no cervical or supraclavicular adenopathy thyroid normal Lungs  clear Cardiac RR, without RMG Abdominal  soft, nontender, without masses, organomegaly or hernia Breasts  examined lying and sitting without masses, retractions, discharge or axillary adenopathy. Pelvic  Ext/BUS/vagina  normal with mild enterocele possible high cystocele.  Adnexa  Without masses or tenderness    Anus and perineum  normal   Rectovaginal  normal sphincter tone without palpated masses or tenderness.    Assessment/Plan:  60 y.o. female for annual exam.    1. Mild enterocele possible high cystocele. Reviewed with patient she is relatively asymptomatic. She does note some pressure when sitting on the toilet intermittently the she's attributed from her bladder. She does have a complex history following the birth of her last child having several bladder surgeries. Sounds like she had some form of sling then had to go back and had her have a revision and ultimate removal and she said quotation part of her bladder" was also removed. We have no records from this. She is essentially asymptomatic over although we'll continue to monitor if it does become an issue refer to urology. I did ask her to try to get copies of her surgeries as a was done elsewhere a number of years ago to have available in case we need to proceed with surgery. 2. Health maintenance. SBE monthly reviewed. Mammogram March 2012 oh continued annual mammography. Colonoscopy January 2012.  Bone density December 2008. Patient will schedule follow up bone density now. Increase calcium vitamin D reviewed. No blood work was done today as was all done through her primary physician's office. Reviewed current Pap smear recommendations. Her last Pap smear was 2011, she has no history of abnormal Paps before, multiple normal Paps documented in her chart. She is status post hysterectomy. We'll plan on less frequent screening interval the patient is comfortable with that.    Dara Lords MD, 3:43 PM 10/05/2011

## 2012-08-03 ENCOUNTER — Encounter: Payer: Self-pay | Admitting: Obstetrics and Gynecology

## 2012-08-03 ENCOUNTER — Ambulatory Visit (INDEPENDENT_AMBULATORY_CARE_PROVIDER_SITE_OTHER): Payer: BC Managed Care – PPO | Admitting: Obstetrics and Gynecology

## 2012-08-03 VITALS — BP 130/72 | Ht 61.0 in | Wt 131.0 lb

## 2012-08-03 DIAGNOSIS — R32 Unspecified urinary incontinence: Secondary | ICD-10-CM

## 2012-08-03 DIAGNOSIS — R300 Dysuria: Secondary | ICD-10-CM

## 2012-08-03 DIAGNOSIS — Z01419 Encounter for gynecological examination (general) (routine) without abnormal findings: Secondary | ICD-10-CM

## 2012-08-03 DIAGNOSIS — A6009 Herpesviral infection of other urogenital tract: Secondary | ICD-10-CM

## 2012-08-03 DIAGNOSIS — N760 Acute vaginitis: Secondary | ICD-10-CM

## 2012-08-03 DIAGNOSIS — A6000 Herpesviral infection of urogenital system, unspecified: Secondary | ICD-10-CM

## 2012-08-03 LAB — POCT URINALYSIS DIPSTICK
Bilirubin, UA: NEGATIVE
Ketones, UA: NEGATIVE
Protein, UA: NEGATIVE
Spec Grav, UA: <=1.005

## 2012-08-03 LAB — POCT WET PREP (WET MOUNT)

## 2012-08-03 MED ORDER — NYSTATIN-TRIAMCINOLONE 100000-0.1 UNIT/GM-% EX OINT: TOPICAL_OINTMENT | Freq: Two times a day (BID) | CUTANEOUS | Status: AC

## 2012-08-03 MED ORDER — VALACYCLOVIR HCL 500 MG PO TABS: 500.0000 mg | ORAL_TABLET | Freq: Every day | ORAL | Status: DC

## 2012-08-03 NOTE — Progress Notes (Signed)
NEW GYNECOLOGIC EXAMINATION  [name] is an 61 y.o. female, G2P2, who presents to the [facility] division of [facility] for a new patient gynecologic examination. She has had a hysterectomy.  She has had several bladder procedures.  She has a history of herpes but has infrequent outbreaks she had her gallbladder removed in 2005.  She had a hip replacement in 2010.  She had a myomectomy in 2004.  Her complains ZOX:WRUEAVW and vulvar burning, and stress urinary incontinence.  She has a history of migraine headaches. .     Pertinent Gynecological History: Patient's last menstrual period was 07/07/2009. Menses: hysterectomy Menarche: 14 years Contraception: hysterectomy DES exposure: unknown Blood transfusions: yes Sexually transmitted diseases: The patient reports a past history of: herpes. Previous GYN Procedures: see history of present illness  Last mammogram: normal Date: 2012. Last pap: hysterectomy Date: hysterectomy History of Abnormal Pap Smears:  No  Obstetrical History:  Vaginal Deliveries at Term:      2 Preterm Vaginal Deliveries:      0 Cesarean Deliveries at Term:  0 Preterm Cesareans:                 0 Miscarriages:                            0 Abortions:                                  0    Past Medical History  Diagnosis Date  . Hypertension   . Hyperlipidemia     ELEVATED CHOLESTEROL  . Colitis   . Yeast infection   . 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 Migraine   . Bladder infection     Past Surgical History  Procedure Date  . Vaginal hysterectomy 03/2006    TVH,BSO, POSTERIOR REPAIR  . Hysteroscopy 03/2003    HYST, D&C FOR POLYP  . Exploratory laparotomy 10/2003    WITH MYOMECTOMY FOR RUPTURED FIBROID  . Bladders surgery   . Hip replacement surgery 07/2009  . Cholecystectomy   . Myomectomy     Family History  Problem Relation Age of Onset  . Hypertension Mother   . Thyroid  disease Sister     Social History:  reports that she has never smoked. She has never used smokeless tobacco. She reports that she drinks alcohol. She reports that she does not use illicit drugs.  Allergies: No Known Allergies  Medications:   Metoprolol succinate 100 mg each day Hydrochlorothiazide 25 mg each day Protonic 40 mg each day Amlodipine 10 mg each day Entocort 3-9 milligrams Lunesta Valtrex Maxalt Lomotil Calcium 1000 mg fish oil 1200 mg Multivitamin  Review of Systems:  See history of present illness and gynecologic history.  Physical Examination:  Blood pressure 130/72, height 5\' 1"  (1.549 m), weight 131 lb (59.421 kg), last menstrual period 07/07/2009. Body mass index is 24.75 kg/(m^2).  General: alert and no distress Resp: clear to auscultation bilaterally Cardio: regular rate and rhythm, S1, S2 normal, no murmur, click, rub or gallop GI: soft, non-tender; bowel sounds normal; no masses,  no organomegaly Breast: No masses, skin changes, or discharge  External genitalia: normal general appearance Vagina: No masses or lesions, relaxation: yes Cervix: absent Uterus: absent Adnexa: No masses, nontender Rectovaginal exam:Confirms  Results  for orders placed in visit on 08/03/12 (from the past 48 hour(s))  POCT URINALYSIS DIPSTICK     Status: Normal   Collection Time   08/03/12  2:12 PM      Component Value Range Comment   Color, UA yellow      Clarity, UA        Glucose, UA neg      Bilirubin, UA neg      Ketones, UA neg      Spec Grav, UA <=1.005      Blood, UA tr      pH, UA 7.5      Protein, UA neg      Urobilinogen, UA negative      Nitrite, UA neg      Leukocytes, UA Trace     POCT WET PREP (WET MOUNT)     Status: Normal   Collection Time   08/03/12  3:27 PM      Component Value Range Comment   Source Wet Prep POC        WBC, Wet Prep HPF POC        Bacteria Wet Prep HPF POC        BACTERIA WET PREP MORPHOLOGY POC        Clue Cells Wet  Prep HPF POC        CLUE CELLS WET PREP WHIFF POC Negative Whiff      Yeast Wet Prep HPF POC        KOH Wet Prep POC        Trichomonas Wet Prep HPF POC        pH 5.0       Wet Prep: yeast  Urinalysis: Negative  Assessment:  Normal female examination  Overweight or obese: No   Pelvic relaxation: Yes  Urinary incontinence  Vaginal burning  Yeast infection  Atrophic vaginal changes  Menopausal symptoms   Plan:    mammogram Return in 2 months Contraception:no method    STD screen request:  No   The updated Pap smear screening guidelines were discussed with the patient. The patient requested that I obtain a Pap smear: No.  Kegel exercises discussed: Yes.  Proper diet and regular exercise were reviewed.  Annual mammograms recommended starting at age 13. Proper breast care was discussed.  Screening colonoscopy is recommended beginning at age 80.  Regular health maintenance was reviewed.  Sleep hygiene was discussed.  Adequate calcium and vitamin D intake was emphasized.  Medications Prescribed:  Valtrex 500 mg each day  Mycolog-II to vulva and vagina  Olive oil to the vulva as needed for comfort  Return to Office: 2 months  We talked about vaginal estrogen.  The patient will keep a long and we will discuss vaginal estrogen if the above measures are not helpful.  We talked about physical therapy for the bladder.  She will do Kegel exercises for now and we will discuss in 2 months.  Leonard Schwartz, M.D. 08/03/2012     Mammogram: yes  Monthly Breast Ex.: no Exercise: yes  Tetanus < 10 years: yes Seatbelts: yes  NI. Bladder Functn.: yes Abuse at home: no  Daily BM's: yes Stressful Work: no  Healthy Diet: yes Sigmoid-Colonoscopy: 12/2010 wnl   Calcium: yes Medical problems this year:none   LAST PAP:a year ago per pt   Contraception: Hysterectomy   Mammogram:  A year ago   PCP: Dr. Uvaldo Rising  PMH: New Gyn   FMH: New Gyn  Last Bone  Scan: a year or two per pt

## 2012-08-04 ENCOUNTER — Other Ambulatory Visit: Payer: Self-pay | Admitting: Obstetrics and Gynecology

## 2012-08-04 DIAGNOSIS — A6009 Herpesviral infection of other urogenital tract: Secondary | ICD-10-CM

## 2012-08-04 MED ORDER — VALACYCLOVIR HCL 500 MG PO TABS: 500.0000 mg | ORAL_TABLET | Freq: Every day | ORAL | Status: AC

## 2012-08-23 ENCOUNTER — Telehealth: Payer: Self-pay | Admitting: Obstetrics and Gynecology

## 2012-08-23 NOTE — Telephone Encounter (Signed)
TRIAGE/SCRIPT NOT WORK.

## 2012-08-23 NOTE — Telephone Encounter (Signed)
Called pt to offer apt for this afternoon 08/23/2012 with SL. Pt states that she is not able to make this. Pt states that she will have to wait until next week. Gave pt apt with EP on 08/31/2012 @ 2:00 p.m. Pt voice understanding.  [name]

## 2012-08-31 ENCOUNTER — Encounter: Payer: BC Managed Care – PPO | Admitting: Obstetrics and Gynecology

## 2012-09-29 ENCOUNTER — Telehealth: Payer: Self-pay | Admitting: Obstetrics and Gynecology

## 2012-09-29 NOTE — Telephone Encounter (Signed)
Spoke with pt rgd msg. Pt stated she had call to make a app and was on hold for a long time. Made app with EP for 09/30/2012 at 11:15 am. Ok per LM . C/o yeast infection . Pt voice understanding . bt cma

## 2012-09-30 ENCOUNTER — Encounter: Payer: Self-pay | Admitting: Obstetrics and Gynecology

## 2012-09-30 ENCOUNTER — Ambulatory Visit (INDEPENDENT_AMBULATORY_CARE_PROVIDER_SITE_OTHER): Payer: BC Managed Care – PPO | Admitting: Obstetrics and Gynecology

## 2012-09-30 VITALS — BP 120/72 | Temp 97.9°F | Wt 131.0 lb

## 2012-09-30 DIAGNOSIS — R21 Rash and other nonspecific skin eruption: Secondary | ICD-10-CM

## 2012-09-30 DIAGNOSIS — N762 Acute vulvitis: Secondary | ICD-10-CM

## 2012-09-30 DIAGNOSIS — N760 Acute vaginitis: Secondary | ICD-10-CM

## 2012-09-30 MED ORDER — ESTRADIOL 0.1 MG/GM VA CREA: TOPICAL_CREAM | VAGINAL | Status: DC

## 2012-09-30 MED ORDER — FLUCONAZOLE 150 MG PO TABS: 150.0000 mg | ORAL_TABLET | Freq: Once | ORAL | Status: AC

## 2012-09-30 MED ORDER — TRIAMCINOLONE ACETONIDE 0.025 % EX OINT: TOPICAL_OINTMENT | Freq: Two times a day (BID) | CUTANEOUS | Status: DC

## 2012-09-30 NOTE — Progress Notes (Signed)
Pt c/o burning on outer vaginal area as well as anal area. No vaginal discharge, recently treated for UTI, pt does not complain of any UTI sx.

## 2012-09-30 NOTE — Patient Instructions (Addendum)
Resume Nystatin/Triamcinolone Cream in affected area twice a day for 14 days along with Estrace Vaginal Cream applied at the same time.  After 14 days, continue to use the Estrace Cream every other day  until it is all gone.   Avoid: - excess soap on genital area (consider using plain oatmeal soap) - use of powder or sprays in genital area - douching - wearing underwear to bed (except with menses) - using more than is directed detergent when washing clothes - tight fitting garments around genital area - excess sugar intake  Fungus Infection of the Skin An infection of your skin caused by a fungus is a very common problem. Treatment depends on which part of the body is affected. Types of fungal skin infection include:  Athlete's Foot(Tinea pedis). This infection starts between the toes and may involve the entire sole and sides of foot. It is the most common fungal disease. It is made worse by heat, moisture, and friction. To treat, wash your feet 2 to 3 times daily. Dry thoroughly between the toes. Use medicated foot powder or cream as directed on the package. Plain talc, cornstarch, or rice powder may be dusted into socks and shoes to keep the feet dry. Wearing footwear that allows ventilation is also helpful.  Ringworm (Tinea corporis and tinea capitis). This infection causes scaly red rings to form on the skin or scalp. For skin sores, apply medicated lotion or cream as directed on the package. For the scalp, medicated shampoo may be used with with other therapies. Ringworm of the scalp or fingernails usually requires using oral medicine for 2 to 4 months.  Tinea versicolor. This infection appears as painless, scaly, patchy areas of discolored skin (whitish to light brown). It is more common in the summer and favors oily areas of the skin such as those found at the chest, abdomen, back, pubis, neck, and body folds. It can be treated with medicated shampoo or with medicated topical cream. Oral  antifungals may be needed for more active infections. The light and/or dark spots may take time to get better and is not a sign of treatment failure. Fungal infections may need to be treated for several weeks to be cured. It is important not to treat fungal infections with steroids or combination medicine that contains an antifungal and steroid as these will make the fungal infection worse. SEEK MEDICAL CARE IF:   You have persistent itching or rawness.  You have an oral temperature above 102 F (38.9 C). Document Released: 12/31/2004 Document Revised: 02/15/2012 Document Reviewed: 03/18/2010 [facility] Patient Information 2013 [place], [place].

## 2012-09-30 NOTE — Progress Notes (Signed)
61 YO complains of vaginal burning that extends to anal area.  Treated  with Cipro 1 week ago then Diflucan 2 days ago and a cream (nystatin) with only temporary relief.  Was given Nystatin/Triamcinolone in September with relief while she was using it but symptoms returned.  O: Pelvic: EGBUS-vulva with atrophic changes, peri-anal area with well circumscribed slightly pigmented rash, vagina-atrophic, uterus/cervix-surgically absent  Wet Prep:  pH-5.0,  whiff-negative,  no clue, yeast or trichomonas  A: Vulvitis with Atrophy-recurrent     Peri-anal rash  P: Resume Nystatin/Triamcinolone Cream bid x 14 days      Estrace Vaginal (samples given #2) apply to external vaginal area bid x 14 days     along with the Nystatin/Triamcinolone then daily until samples are all gone      Perineal hygiene      RTO-as scheduled or prn  Akram Kissick, PA-C

## 2012-11-17 ENCOUNTER — Ambulatory Visit (INDEPENDENT_AMBULATORY_CARE_PROVIDER_SITE_OTHER): Payer: BC Managed Care – PPO | Admitting: Obstetrics and Gynecology

## 2012-11-17 ENCOUNTER — Encounter: Payer: Self-pay | Admitting: Obstetrics and Gynecology

## 2012-11-17 VITALS — BP 124/62 | Ht 61.0 in | Wt 132.0 lb

## 2012-11-17 DIAGNOSIS — N899 Noninflammatory disorder of vagina, unspecified: Secondary | ICD-10-CM

## 2012-11-17 DIAGNOSIS — N898 Other specified noninflammatory disorders of vagina: Secondary | ICD-10-CM

## 2012-11-17 DIAGNOSIS — N94818 Other vulvodynia: Secondary | ICD-10-CM

## 2012-11-17 DIAGNOSIS — B009 Herpesviral infection, unspecified: Secondary | ICD-10-CM

## 2012-11-17 MED ORDER — VALACYCLOVIR HCL 500 MG PO TABS: 500.0000 mg | ORAL_TABLET | Freq: Two times a day (BID) | ORAL | Status: AC

## 2012-11-17 NOTE — Addendum Note (Signed)
Addended by: Tim Lair on: 11/17/2012 11:58 AM   Modules accepted: Orders

## 2012-11-17 NOTE — Progress Notes (Signed)
HISTORY OF PRESENT ILLNESS  [name] is a 61 y.o. year old female,G2P2, who presents for a problem visit. The patient has a history of vulvar and vaginal burning. She has tried all goals, steroid creams, and estrogen. She improves for short period of time and then the discomfort returns. She has a history of herpes virus. She is status post hysterectomy.  Subjective:  See above  Objective:  BP 124/62  Ht 5\' 1"  (1.549 m)  Wt 132 lb (59.875 kg)  BMI 24.94 kg/m2  LMP 07/07/2009   General: no distress GI: soft and nontender  External genitalia: 2 healing ulcers on the left labia Vaginal: atrophic mucosa Adnexa: normal bimanual exam  Assessment:  Probable recurrent herpes outbreak  Plan:  Valtrex 1000 mg daily to twice daily x5 days  Zovirax ointment as needed  Return to office prn if symptoms worsen or fail to improve.   Leonard Schwartz M.D.  11/17/2012 11:22 AM

## 2012-12-13 ENCOUNTER — Encounter: Payer: BC Managed Care – PPO | Admitting: Obstetrics and Gynecology

## 2013-02-10 ENCOUNTER — Telehealth: Payer: Self-pay | Admitting: Obstetrics and Gynecology

## 2013-02-10 NOTE — Telephone Encounter (Signed)
TC to pt. States is currently having HSV outbreak. Has taken VAltrex 500 mg bid x 2 days with not improvement. Declines eval due to inclement weather. Per SL, informed may take 1000 mg bid x 5 days. To call if no improvement. Has appt 02/14/13.

## 2013-02-14 ENCOUNTER — Encounter: Payer: BC Managed Care – PPO | Admitting: Obstetrics and Gynecology

## 2013-03-14 ENCOUNTER — Other Ambulatory Visit: Payer: Self-pay | Admitting: Obstetrics and Gynecology

## 2014-02-24 ENCOUNTER — Encounter: Payer: Self-pay | Admitting: *Deleted

## 2014-07-05 ENCOUNTER — Encounter (HOSPITAL_BASED_OUTPATIENT_CLINIC_OR_DEPARTMENT_OTHER): Payer: Self-pay | Admitting: *Deleted

## 2014-07-05 NOTE — Progress Notes (Signed)
NPO AFTER MN WITH EXCEPTION CLEAR LIQUIDS UNTIL 0900 (NO CREAM/ MILK PRODUCTS). ARRIVE AT 1300. NEEDS ISTAT AND EKG. WILL TAKE LIPITOR, NORVASC, TOPROL, AND PROTONIX AM DOS W/ SIPS OF WATER.

## 2014-07-12 ENCOUNTER — Encounter (HOSPITAL_BASED_OUTPATIENT_CLINIC_OR_DEPARTMENT_OTHER)
Admission: RE | Disposition: A | Discharge status: Home / Self Care | Payer: Self-pay | Source: Ambulatory Visit | Attending: Orthopedic Surgery

## 2014-07-12 ENCOUNTER — Encounter (HOSPITAL_BASED_OUTPATIENT_CLINIC_OR_DEPARTMENT_OTHER): Payer: No Typology Code available for payment source | Admitting: Anesthesiology

## 2014-07-12 ENCOUNTER — Other Ambulatory Visit: Payer: Self-pay

## 2014-07-12 ENCOUNTER — Encounter (HOSPITAL_BASED_OUTPATIENT_CLINIC_OR_DEPARTMENT_OTHER): Payer: Self-pay | Admitting: *Deleted

## 2014-07-12 ENCOUNTER — Ambulatory Visit (HOSPITAL_BASED_OUTPATIENT_CLINIC_OR_DEPARTMENT_OTHER)
Admission: RE | Admit: 2014-07-12 | Discharge: 2014-07-12 | Disposition: A | Discharge status: Home / Self Care | Payer: No Typology Code available for payment source | Source: Ambulatory Visit | Attending: Orthopedic Surgery | Admitting: Orthopedic Surgery

## 2014-07-12 ENCOUNTER — Ambulatory Visit (HOSPITAL_BASED_OUTPATIENT_CLINIC_OR_DEPARTMENT_OTHER): Payer: No Typology Code available for payment source | Admitting: Anesthesiology

## 2014-07-12 DIAGNOSIS — G5600 Carpal tunnel syndrome, unspecified upper limb: Secondary | ICD-10-CM | POA: Insufficient documentation

## 2014-07-12 DIAGNOSIS — K219 Gastro-esophageal reflux disease without esophagitis: Secondary | ICD-10-CM | POA: Insufficient documentation

## 2014-07-12 DIAGNOSIS — B009 Herpesviral infection, unspecified: Secondary | ICD-10-CM | POA: Insufficient documentation

## 2014-07-12 DIAGNOSIS — G5602 Carpal tunnel syndrome, left upper limb: Secondary | ICD-10-CM

## 2014-07-12 DIAGNOSIS — M19049 Primary osteoarthritis, unspecified hand: Secondary | ICD-10-CM | POA: Diagnosis not present

## 2014-07-12 DIAGNOSIS — I10 Essential (primary) hypertension: Secondary | ICD-10-CM | POA: Diagnosis not present

## 2014-07-12 DIAGNOSIS — E785 Hyperlipidemia, unspecified: Secondary | ICD-10-CM | POA: Insufficient documentation

## 2014-07-12 HISTORY — DX: Other specified health status: Z78.9

## 2014-07-12 HISTORY — PX: CARPAL TUNNEL RELEASE: SHX101

## 2014-07-12 HISTORY — DX: Gastro-esophageal reflux disease without esophagitis: K21.9

## 2014-07-12 HISTORY — DX: Herpesviral infection, unspecified: B00.9

## 2014-07-12 HISTORY — DX: Personal history of other diseases of the digestive system: Z87.19

## 2014-07-12 HISTORY — DX: Dry eye syndrome of bilateral lacrimal glands: H04.123

## 2014-07-12 HISTORY — DX: Unspecified osteoarthritis, unspecified site: M19.90

## 2014-07-12 LAB — POCT I-STAT 4, (NA,K, GLUC, HGB,HCT)
Glucose, Bld: 88 mg/dL (ref 70–99)
HCT: 42 % (ref 36.0–46.0)
Hemoglobin: 14.3 g/dL (ref 12.0–15.0)
Potassium: 3.8 mEq/L (ref 3.7–5.3)
Sodium: 140 mEq/L (ref 137–147)

## 2014-07-12 SURGERY — CARPAL TUNNEL RELEASE
Anesthesia: Monitor Anesthesia Care | Site: Hand | Laterality: Left

## 2014-07-12 MED ORDER — CHLORHEXIDINE GLUCONATE 4 % EX LIQD
60.0000 mL | Freq: Once | CUTANEOUS | Status: DC
  Filled 2014-07-12: qty 60

## 2014-07-12 MED ORDER — LACTATED RINGERS IV SOLN
INTRAVENOUS | Status: DC
  Administered 2014-07-12: 14:00:00 via INTRAVENOUS
  Filled 2014-07-12: qty 1000

## 2014-07-12 MED ORDER — PROPOFOL INFUSION 10 MG/ML OPTIME
INTRAVENOUS | Status: DC | PRN
  Administered 2014-07-12: 200 ug/kg/min via INTRAVENOUS

## 2014-07-12 MED ORDER — MIDAZOLAM HCL 5 MG/5ML IJ SOLN
INTRAMUSCULAR | Status: DC | PRN
  Administered 2014-07-12: 2 mg via INTRAVENOUS

## 2014-07-12 MED ORDER — STERILE WATER FOR IRRIGATION IR SOLN
Status: DC | PRN
  Administered 2014-07-12: 1

## 2014-07-12 MED ORDER — HYDROCODONE-ACETAMINOPHEN 5-300 MG PO TABS: 1.0000 | ORAL_TABLET | Freq: Four times a day (QID) | ORAL | Status: DC | PRN

## 2014-07-12 MED ORDER — BUPIVACAINE HCL (PF) 0.25 % IJ SOLN
INTRAMUSCULAR | Status: DC | PRN
  Administered 2014-07-12: 7.5 mL

## 2014-07-12 MED ORDER — MIDAZOLAM HCL 2 MG/2ML IJ SOLN
INTRAMUSCULAR | Status: AC
  Filled 2014-07-12: qty 2

## 2014-07-12 MED ORDER — LIDOCAINE HCL (CARDIAC) 20 MG/ML IV SOLN
INTRAVENOUS | Status: DC | PRN
  Administered 2014-07-12: 50 mg via INTRAVENOUS

## 2014-07-12 MED ORDER — FENTANYL CITRATE 0.05 MG/ML IJ SOLN
INTRAMUSCULAR | Status: DC | PRN
  Administered 2014-07-12 (×2): 50 ug via INTRAVENOUS

## 2014-07-12 MED ORDER — LIDOCAINE HCL (PF) 1 % IJ SOLN
INTRAMUSCULAR | Status: DC | PRN
  Administered 2014-07-12: 7.5 mL

## 2014-07-12 MED ORDER — CEFAZOLIN SODIUM-DEXTROSE 2-3 GM-% IV SOLR
2.0000 g | INTRAVENOUS | Status: DC
  Filled 2014-07-12: qty 50

## 2014-07-12 MED ORDER — DEXAMETHASONE SODIUM PHOSPHATE 10 MG/ML IJ SOLN
INTRAMUSCULAR | Status: DC | PRN
  Administered 2014-07-12: 10 mg via INTRAVENOUS

## 2014-07-12 MED ORDER — FENTANYL CITRATE 0.05 MG/ML IJ SOLN
INTRAMUSCULAR | Status: AC
  Filled 2014-07-12: qty 4

## 2014-07-12 MED ORDER — CEFAZOLIN SODIUM-DEXTROSE 2-3 GM-% IV SOLR
INTRAVENOUS | Status: DC | PRN
  Administered 2014-07-12: 2 g via INTRAVENOUS

## 2014-07-12 MED ORDER — DOCUSATE SODIUM 100 MG PO CAPS: 100.0000 mg | ORAL_CAPSULE | Freq: Two times a day (BID) | ORAL | Status: DC

## 2014-07-12 MED ORDER — ONDANSETRON HCL 4 MG/2ML IJ SOLN
INTRAMUSCULAR | Status: DC | PRN
  Administered 2014-07-12: 4 mg via INTRAVENOUS

## 2014-07-12 SURGICAL SUPPLY — 50 items
BANDAGE ELASTIC 3 VELCRO ST LF (GAUZE/BANDAGES/DRESSINGS) ×3 IMPLANT
BLADE SURG 15 STRL LF DISP TIS (BLADE) ×1 IMPLANT
BLADE SURG 15 STRL SS (BLADE) ×3
BNDG CMPR 9X4 STRL LF SNTH (GAUZE/BANDAGES/DRESSINGS) ×1
BNDG CONFORM 3 STRL LF (GAUZE/BANDAGES/DRESSINGS) ×3 IMPLANT
BNDG ESMARK 4X9 LF (GAUZE/BANDAGES/DRESSINGS) ×3 IMPLANT
CORDS BIPOLAR (ELECTRODE) IMPLANT
COVER MAYO STAND STRL (DRAPES) ×1 IMPLANT
COVER TABLE BACK 60X90 (DRAPES) ×3 IMPLANT
CUFF TOURNIQUET SINGLE 18IN (TOURNIQUET CUFF) ×2 IMPLANT
DRAPE EXTREMITY T 121X128X90 (DRAPE) ×3 IMPLANT
DRAPE LG THREE QUARTER DISP (DRAPES) ×6 IMPLANT
DRAPE SURG 17X23 STRL (DRAPES) ×3 IMPLANT
DRAPE U-SHAPE 47X51 STRL (DRAPES) IMPLANT
DRSG EMULSION OIL 3X3 NADH (GAUZE/BANDAGES/DRESSINGS) IMPLANT
GAUZE SPONGE 4X4 16PLY XRAY LF (GAUZE/BANDAGES/DRESSINGS) IMPLANT
GAUZE XEROFORM 1X8 LF (GAUZE/BANDAGES/DRESSINGS) ×3 IMPLANT
GLOVE BIO SURGEON STRL SZ8 (GLOVE) ×3 IMPLANT
GLOVE BIOGEL PI IND STRL 7.0 (GLOVE) IMPLANT
GLOVE BIOGEL PI IND STRL 7.5 (GLOVE) IMPLANT
GLOVE BIOGEL PI IND STRL 8.5 (GLOVE) ×1 IMPLANT
GLOVE BIOGEL PI INDICATOR 7.0 (GLOVE) ×2
GLOVE BIOGEL PI INDICATOR 7.5 (GLOVE) ×4
GLOVE BIOGEL PI INDICATOR 8.5 (GLOVE) ×2
GOWN STRL REUS W/ TWL LRG LVL3 (GOWN DISPOSABLE) ×1 IMPLANT
GOWN STRL REUS W/ TWL XL LVL3 (GOWN DISPOSABLE) ×1 IMPLANT
GOWN STRL REUS W/TWL LRG LVL3 (GOWN DISPOSABLE)
GOWN STRL REUS W/TWL XL LVL3 (GOWN DISPOSABLE) ×2 IMPLANT
KNIFE CARPAL TUNNEL (BLADE) IMPLANT
LOOP VESSEL MAXI BLUE (MISCELLANEOUS) IMPLANT
NDL HYPO 25X1 1.5 SAFETY (NEEDLE) ×2 IMPLANT
NDL SAFETY ECLIPSE 18X1.5 (NEEDLE) ×2 IMPLANT
NEEDLE HYPO 18GX1.5 SHARP (NEEDLE) ×3
NEEDLE HYPO 25X1 1.5 SAFETY (NEEDLE) ×3 IMPLANT
NS IRRIG 500ML POUR BTL (IV SOLUTION) ×1 IMPLANT
PACK BASIN DAY SURGERY FS (CUSTOM PROCEDURE TRAY) ×3 IMPLANT
PAD ALCOHOL SWAB (MISCELLANEOUS) ×12 IMPLANT
PAD CAST 3X4 CTTN HI CHSV (CAST SUPPLIES) IMPLANT
PADDING CAST ABS 4INX4YD NS (CAST SUPPLIES) ×2
PADDING CAST ABS COTTON 4X4 ST (CAST SUPPLIES) ×1 IMPLANT
PADDING CAST COTTON 3X4 STRL (CAST SUPPLIES) ×3
SPONGE GAUZE 4X4 12PLY STER LF (GAUZE/BANDAGES/DRESSINGS) ×3 IMPLANT
STOCKINETTE 4X48 STRL (DRAPES) ×3 IMPLANT
SUT PROLENE 4 0 PS 2 18 (SUTURE) ×3 IMPLANT
SYR BULB 3OZ (MISCELLANEOUS) ×3 IMPLANT
SYR CONTROL 10ML LL (SYRINGE) ×4 IMPLANT
TOWEL OR 17X24 6PK STRL BLUE (TOWEL DISPOSABLE) ×3 IMPLANT
TRAY DSU PREP LF (CUSTOM PROCEDURE TRAY) ×3 IMPLANT
UNDERPAD 30X30 INCONTINENT (UNDERPADS AND DIAPERS) ×3 IMPLANT
WATER STERILE IRR 500ML POUR (IV SOLUTION) ×2 IMPLANT

## 2014-07-12 NOTE — Anesthesia Preprocedure Evaluation (Signed)
Anesthesia Evaluation  Patient identified by MRN, date of birth, ID band Patient awake    Reviewed: Allergy & Precautions, H&P , NPO status , Patient's Chart, lab work & pertinent test results  History of Anesthesia Complications (+) PONV and history of anesthetic complications  Airway Mallampati: II TM Distance: >3 FB Neck ROM: Full    Dental no notable dental hx.    Pulmonary neg pulmonary ROS,  breath sounds clear to auscultation  Pulmonary exam normal       Cardiovascular hypertension, Pt. on medications Rhythm:Regular Rate:Normal     Neuro/Psych negative neurological ROS  negative psych ROS   GI/Hepatic Neg liver ROS, GERD-  Medicated,  Endo/Other  negative endocrine ROS  Renal/GU negative Renal ROS     Musculoskeletal negative musculoskeletal ROS (+)   Abdominal   Peds  Hematology negative hematology ROS (+)   Anesthesia Other Findings   Reproductive/Obstetrics negative OB ROS                           Anesthesia Physical Anesthesia Plan  ASA: II  Anesthesia Plan: MAC   Post-op Pain Management:    Induction: Intravenous  Airway Management Planned:   Additional Equipment:   Intra-op Plan:   Post-operative Plan:   Informed Consent: I have reviewed the patients History and Physical, chart, labs and discussed the procedure including the risks, benefits and alternatives for the proposed anesthesia with the patient or authorized representative who has indicated his/her understanding and acceptance.   Dental advisory given  Plan Discussed with: CRNA  Anesthesia Plan Comments:         Anesthesia Quick Evaluation

## 2014-07-12 NOTE — Anesthesia Postprocedure Evaluation (Signed)
  Anesthesia Post-op Note  Patient: [name]  Procedure(s) Performed: Procedure(s): LEFT CARPAL TUNNEL RELEASE (Left)  Patient Location: PACU  Anesthesia Type:MAC  Level of Consciousness: awake, alert  and oriented  Airway and Oxygen Therapy: Patient Spontanous Breathing  Post-op Pain: none  Post-op Assessment: Post-op Vital signs reviewed  Post-op Vital Signs: Reviewed  Last Vitals:  Filed Vitals:   07/12/14 1323  BP: 132/79  Pulse: 61  Temp: 36.3 C  Resp: 16    Complications: No apparent anesthesia complications

## 2014-07-12 NOTE — Discharge Instructions (Signed)
KEEP BANDAGE CLEAN AND DRY CALL OFFICE FOR F/U APPT [phone] DR Melvyn NovasTMANN CELL [phone] KEEP HAND ELEVATED ABOVE HEART OK TO APPLY ICE TO OPERATIVE AREA CONTACT OFFICE IF ANY WORSENING PAIN OR CONCERNS.        HAND SURGERY    HOME CARE INSTRUCTIONS    The following instructions have been prepared to help you care for yourself upon your return home today.  Wound Care:  Keep your hand elevated above the level of your heart. Do not allow it to dangle by your side. Keep the dressing dry and do not remove it unless your doctor advises you to do so. He will usually change it at the time of you post-op visit. Moving your fingers is advised to stimulate circulation but will depend on the site of your surgery. Of course, if you have a splint applied your doctor will advise you about movement.  Activity:  Do not drive or operate machinery today. Rest today and then you may return to your normal activity and work as indicated by your physician.  Diet: Drink liquids today or eat a light diet. You may resume a regular diet tomorrow.  General expectations: Pain for two or three days. Fingers may become slightly swollen.   Unexpected Observations- Call your doctor if any of these occur: Severe pain not relieved by pain medication. Elevated temperature. Dressing soaked with blood. Inability to move fingers. White or bluish color to fingers.      Post Anesthesia Home Care Instructions  Activity: Get plenty of rest for the remainder of the day. A responsible adult should stay with you for 24 hours following the procedure.  For the next 24 hours, DO NOT: -Drive a car -Advertising copywriterperate machinery -Drink alcoholic beverages -Take any medication unless instructed by your physician -Make any legal decisions or sign important papers.  Meals: Start with liquid foods such as gelatin or soup. Progress to regular foods as tolerated. Avoid greasy, spicy, heavy foods. If nausea and/or vomiting occur, drink  only clear liquids until the nausea and/or vomiting subsides. Call your physician if vomiting continues.  Special Instructions/Symptoms: Your throat may feel dry or sore from the anesthesia or the breathing tube placed in your throat during surgery. If this causes discomfort, gargle with warm salt water. The discomfort should disappear within 24 hours.

## 2014-07-12 NOTE — H&P (Signed)
[name] is an 63 y.o. female.   Chief Complaint: left hand numbness HPI: Pt with longstanding left hand carpal tunnel syndrome Pt here for surgery Pt followed in office   Past Medical History  Diagnosis Date  . Hypertension   . Hyperlipidemia   . HSV-2 infection   . Left carpal tunnel syndrome   . GERD (gastroesophageal reflux disease)   . History of colitis   . Gluten free diet   . PONV (postoperative nausea and vomiting)   . Arthritis     hands  . Wears glasses   . Dry eyes   . History of migraine     Past Surgical History  Procedure Laterality Date  . Vaginal hysterectomy  03-23-2006    w/ bilateral salpingoophorectomy/  posterior rectocele repair/  mccall culdoplasty  . Dx laparoscopy/ exploratory laparotomy/ abdominal myomectomy/ fulgeration endometriosis  10-23-2003  . 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 Cholecystectomy  2005  . Hysteroscopy w/d&c  04/ 2004    W/  POLYPECTOMY    Family History  Problem Relation Age of Onset  . Hypertension Mother   . Thyroid disease Sister    Social History:  reports that she has never smoked. She has never used smokeless tobacco. She reports that she does not drink alcohol or use illicit drugs.  Allergies: No Known Allergies  Medications Prior to Admission  Medication Sig Dispense Refill  . amLODipine (NORVASC) 10 MG tablet Take 10 mg by mouth every morning.       Marland Kitchen atorvastatin (LIPITOR) 10 MG tablet Take 10 mg by mouth every morning.      . Calcium-Magnesium-Vitamin D (CALCIUM 1200+D3 PO) Take 1 tablet by mouth daily.      . cycloSPORINE (RESTASIS) 0.05 % ophthalmic emulsion Place 1 drop into both eyes 2 (two) times daily.      . eszopiclone (LUNESTA) 1 MG TABS Take 1 mg by mouth at bedtime as needed. Take immediately before bedtime      . metoprolol succinate (TOPROL-XL) 50 MG 24 hr tablet Take 50 mg by mouth every morning. Take with or immediately following a meal.      . Multiple Vitamin (MULTIVITAMIN)  tablet Take 1 tablet by mouth daily.      . Omega-3 Fatty Acids (FISH OIL) 1000 MG CAPS Take 2 capsules by mouth daily.      . pantoprazole (PROTONIX) 40 MG tablet Take 40 mg by mouth every morning.      Marland Kitchen spironolactone-hydrochlorothiazide (ALDACTAZIDE) 25-25 MG per tablet Take 1 tablet by mouth every morning.      . valACYclovir (VALTREX) 1000 MG tablet Take 500 mg by mouth daily.        No results found for this or any previous visit (from the past 48 hour(s)). No results found.  ROS NO RECENT ILLNESSES OR HOSPITALIZATIONS  Blood pressure 132/79, pulse 61, temperature 97.4 F (36.3 C), temperature source Oral, resp. rate 16, height 5\' 1"  (1.549 m), weight 58.06 kg (128 lb), last menstrual period 07/07/2009, SpO2 99.00%. Physical Exam  General Appearance:  Alert, cooperative, no distress, appears stated age  Head:  Normocephalic, without obvious abnormality, atraumatic  Eyes:  Pupils equal, conjunctiva/corneas clear,         Throat: Lips, mucosa, and tongue normal; teeth and gums normal  Neck: No visible masses     Lungs:   respirations unlabored  Chest Wall:  No tenderness or deformity  Heart:  Regular rate and rhythm,  Abdomen:  Soft, non-tender,         Extremities: LEFT HAND: ABLE TO PALMAR ABDUCT THUMB THUMB WARM WELL PERFUSED GOOD THUMB AND DIGITAL MOTION FINGERS WARM WELL PERFUSED  Pulses: 2+ and symmetric  Skin: Skin color, texture, turgor normal, no rashes or lesions     Neurologic: Normal    ASSESSMENT:  LEFT HAND CARPAL TUNNEL SYNDROME  LEFT HAND CARPAL TUNNEL RELEASE  R/B/A DISCUSSED WITH PT IN OFFICE.  PT VOICED UNDERSTANDING OF PLAN CONSENT SIGNED DAY OF SURGERY PT SEEN AND EXAMINED PRIOR TO OPERATIVE PROCEDURE/DAY OF SURGERY SITE MARKED. QUESTIONS ANSWERED WILL GO HOME FOLLOWING SURGERY  WE ARE PLANNING SURGERY FOR YOUR UPPER EXTREMITY. THE RISKS AND BENEFITS OF SURGERY INCLUDE BUT NOT LIMITED TO BLEEDING INFECTION, DAMAGE TO NEARBY NERVES  ARTERIES TENDONS, FAILURE OF SURGERY TO ACCOMPLISH ITS INTENDED GOALS, PERSISTENT SYMPTOMS AND NEED FOR FURTHER SURGICAL INTERVENTION. WITH THIS IN MIND WE WILL PROCEED. I HAVE DISCUSSED WITH THE PATIENT THE PRE AND POSTOPERATIVE REGIMEN AND THE DOS AND DON'TS. PT VOICED UNDERSTANDING AND INFORMED CONSENT SIGNED.  Sharma CovertTMANN,Espiridion Supinski W 07/12/2014, 2:42 PM

## 2014-07-12 NOTE — Transfer of Care (Signed)
Immediate Anesthesia Transfer of Care Note  Patient: [name]  Procedure(s) Performed: Procedure(s): LEFT CARPAL TUNNEL RELEASE (Left)  Patient Location: PACU  Anesthesia Type:MAC  Level of Consciousness: awake, alert , oriented and patient cooperative  Airway & Oxygen Therapy: Patient Spontanous Breathing and Patient connected to nasal cannula oxygen  Post-op Assessment: Report given to PACU RN and Post -op Vital signs reviewed and stable  Post vital signs: Reviewed and stable  Complications: No apparent anesthesia complications

## 2014-07-12 NOTE — Brief Op Note (Signed)
07/12/2014  2:44 PM  PATIENT:  Rush LandmarkLeslie A Kurtzman  63 y.o. female  PRE-OPERATIVE DIAGNOSIS:  LEFT CARPAL TUNNEL SYNDROME  POST-OPERATIVE DIAGNOSIS:  * No post-op diagnosis entered *  PROCEDURE:  Procedure(s): LEFT CARPAL TUNNEL RELEASE (Left)  SURGEON:  Surgeon(s) and Role:    * Sharma CovertFred W Tniya Bowditch, MD - Primary  PHYSICIAN ASSISTANT:   ASSISTANTS: none   ANESTHESIA:   local  EBL:     BLOOD ADMINISTERED:none  DRAINS: none   LOCAL MEDICATIONS USED:  MARCAINE     SPECIMEN:  No Specimen  DISPOSITION OF SPECIMEN:  N/A  COUNTS:  YES  TOURNIQUET:    DICTATIO: [id number]: [id number]  PLAN OF CARE: Discharge to home after PACU  PATIENT DISPOSITION:  PACU - hemodynamically stable.   Delay start of Pharmacological VTE agent (>24hrs) due to surgical blood loss or risk of bleeding: not applicable

## 2014-07-12 NOTE — Anesthesia Procedure Notes (Signed)
Procedure Name: MAC Date/Time: 07/12/2014 3:50 PM Performed by: Tyrone NineSAUVE, Aiyah Scarpelli F Pre-anesthesia Checklist: Patient identified, Timeout performed, Emergency Drugs available, Suction available and Patient being monitored Patient Re-evaluated:Patient Re-evaluated prior to inductionOxygen Delivery Method: Nasal cannula Intubation Type: IV induction Placement Confirmation: positive ETCO2 Dental Injury: Teeth and Oropharynx as per pre-operative assessment

## 2014-07-13 ENCOUNTER — Encounter (HOSPITAL_BASED_OUTPATIENT_CLINIC_OR_DEPARTMENT_OTHER): Payer: Self-pay | Admitting: Orthopedic Surgery

## 2014-07-13 NOTE — Op Note (Signed)
NAMELENSEY, ALVARADO NO.:  [id number]  MEDICAL RECORD NO.:  [id number]  LOCATION:                                 FACILITY:  PHYSICIAN:  Madelynn Done, MD  DATE OF BIRTH:  Jun 24, 1951  DATE OF PROCEDURE:  07/12/2014 DATE OF DISCHARGE:                              OPERATIVE REPORT   PREOPERATIVE DIAGNOSIS:  Left hand carpal tunnel syndrome.  POSTOPERATIVE DIAGNOSIS:  Left hand carpal tunnel syndrome.  ATTENDING PHYSICIAN:  Madelynn Done, M.D., who scrubbed and present for the entire procedure.  ASSISTANT SURGEON:  None.  ANESTHESIA:  Xylocaine 1% and 0.25% Marcaine local block with IV sedation.  SURGICAL PROCEDURE:  Left hand carpal tunnel release.  SURGICAL INDICATIONS:  Mrs. Eader is a 63 year old right-hand-dominant female with signs and symptoms consistent with left hand carpal tunnel syndrome.  This has been refractory to conservative treatment.  Risks, benefits, and alternatives were discussed in detail with the patient and the patient elected to undergo the above procedure.  Risks include, but not limited to bleeding, infection, damage to nearby nerves, arteries, or tendons, loss of motion of the wrist and digits, incomplete relief of symptoms, and need for further surgical intervention.  DESCRIPTION OF PROCEDURE:  The patient was properly identified in the preop holding area and marked with a permanent marker made on the left hand to indicate the correct operative site.  The patient was then brought back to the operating room, placed supine on the anesthesia room table where the IV sedation was administered.  The patient tolerated this well.  A well-padded tourniquet was then placed on the left forearm and sealed with 1000 drape.  Local anesthetic was administered.  Left upper extremity was then prepped and draped in normal sterile fashion. Time-out was called.  Correct site was identified, and procedure was then begun.  Attention  turned to the left hand, where the limb was then elevated and the tourniquet insufflated.  Several centimeter incision made directly in the mid palm.  Dissection was carried down through the skin and subcutaneous tissue.  The palmar fascia was incised longitudinally to expose the transverse carpal ligament.  It was then carried out under direct visualization and the distal 1/2 transverse carpal ligament released in its entirety.  Further exposure was then carried out proximally.  The remaining port of the carpal tunnel release guide was then placed on the transverse carpal ligament, slid proximally.  Contact with the transverse carpal ligament was maintained throughout passing.  Following this, the blade was engaged in guide ring forcing transverse carpal ligament release.  The wound was then thoroughly irrigated.  Copious irrigation done.  Careful protection of median nerve was done throughout.  The _______ of the carpal canal were inspected and no other abnormalities noted.  The wound was irrigated. The skin was then closed using horizontal mattress Prolene sutures. Tourniquet deflated.  Xeroform dressing and sterile compressive bandage were then applied.  The patient tolerated the procedure well, returned to the recovery room in good condition.  POSTPROCEDURE PLAN:  The patient was discharged home, will be seen back in the office in approximately 2 weeks for wound check,  suture removal, begin a postoperative carpal tunnel evaluation and treat.     Madelynn Done, MD     FWO/MEDQ  D:  07/12/2014  T:  07/13/2014  Job:  [id number]

## 2014-10-08 ENCOUNTER — Encounter (HOSPITAL_BASED_OUTPATIENT_CLINIC_OR_DEPARTMENT_OTHER): Payer: Self-pay | Admitting: Orthopedic Surgery

## 2014-11-16 NOTE — H&P (Signed)
TOTAL HIP ADMISSION H&P  Patient is admitted for left total hip arthroplasty, anterior approach.  Subjective:  Chief Complaint:    Left hip primary OA / pain  HPI: [name], 63 y.o. female, has a history of pain and functional disability in the left hip(s) due to arthritis and patient has failed non-surgical conservative treatments for greater than 12 weeks to include NSAID's and/or analgesics, corticosteriod injections and activity modification.  Onset of symptoms was gradual starting 2+ years ago with gradually worsening course since that time.The patient noted prior procedures of the hip to include arthroplasty, posterior approach on the right hip about 5 years ago.  Patient currently rates pain in the left hip at 8 out of 10 with activity. Patient has night pain, worsening of pain with activity and weight bearing, trendelenberg gait, pain that interfers with activities of daily living and pain with passive range of motion. Patient has evidence of periarticular osteophytes and joint space narrowing by imaging studies. This condition presents safety issues increasing the risk of falls.   There is no current active infection.  Risks, benefits and expectations were discussed with the patient.  Risks including but not limited to the risk of anesthesia, blood clots, nerve damage, blood vessel damage, failure of the prosthesis, infection and up to and including death.  Patient understand the risks, benefits and expectations and wishes to proceed with surgery.   PCP: Gweneth DimitriMCNEILL,WENDY, MD  D/C Plans:      Home with HHPT  Post-op Meds:       No Rx given  Tranexamic Acid:      To be given - IV    Decadron:      Is to be given  FYI:     ASA  Norco    Patient Active Problem List   Diagnosis Date Noted  . Urinary incontinence 08/03/2012  . Colitis   . Hypertension   . Hyperlipidemia    Past Medical History  Diagnosis Date  . Hypertension   . Hyperlipidemia   . HSV-2 infection   . Left  carpal tunnel syndrome   . GERD (gastroesophageal reflux disease)   . History of colitis   . Gluten free diet   . PONV (postoperative nausea and vomiting)   . Arthritis     hands  . Wears glasses   . Dry eyes   . History of migraine     Past Surgical History  Procedure Laterality Date  . Vaginal hysterectomy  03-23-2006    w/ bilateral salpingoophorectomy/  posterior rectocele repair/  mccall culdoplasty  . Dx laparoscopy/ exploratory laparotomy/ abdominal myomectomy/ fulgeration endometriosis  10-23-2003  . 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 Cholecystectomy  2005  . Hysteroscopy w/d&c  04/ 2004    W/  POLYPECTOMY  . Carpal tunnel release Left 07/12/2014    Procedure: LEFT CARPAL TUNNEL RELEASE;  Surgeon: Sharma CovertFred W Ortmann, MD;  [facility];  Service: Orthopedics;  Laterality: Left;    No prescriptions prior to admission   No Known Allergies   History  Substance Use Topics  . Smoking status: Never Smoker   . Smokeless tobacco: Never Used  . Alcohol Use: No    Family History  Problem Relation Age of Onset  . Hypertension Mother   . Thyroid disease Sister      Review of Systems  Constitutional: Negative.   Eyes: Negative.   Respiratory: Negative.   Cardiovascular: Negative.   Gastrointestinal: Positive for heartburn and  diarrhea.  Genitourinary: Positive for frequency.  Musculoskeletal: Positive for joint pain.  Skin: Negative.   Neurological: Positive for headaches.  Endo/Heme/Allergies: Negative.   Psychiatric/Behavioral: Negative.     Objective:  Physical Exam  Constitutional: She is oriented to person, place, and time. She appears well-developed and well-nourished.  HENT:  Head: Normocephalic and atraumatic.  Eyes: Pupils are equal, round, and reactive to light.  Neck: Neck supple. No JVD present. No tracheal deviation present. No thyromegaly present.  Cardiovascular: Normal rate, regular rhythm, normal heart sounds and intact  distal pulses.   Respiratory: Effort normal and breath sounds normal. No stridor. No respiratory distress. She has no wheezes.  GI: Soft. There is no tenderness. There is no guarding.  Musculoskeletal:       Left hip: She exhibits decreased range of motion, decreased strength, tenderness and bony tenderness. She exhibits no swelling, no deformity and no laceration.  Lymphadenopathy:    She has no cervical adenopathy.  Neurological: She is alert and oriented to person, place, and time.  Skin: Skin is warm and dry.  Psychiatric: She has a normal mood and affect.      Labs:  Estimated body mass index is 24.20 kg/(m^2) as calculated from the following:   Height as of 07/05/14: 5\' 1"  (1.549 m).   Weight as of 07/12/14: 58.06 kg (128 lb).   Imaging Review Plain radiographs demonstrate severe degenerative joint disease of the left hip(s). The bone quality appears to be good for age and reported activity level.  Assessment/Plan:  End stage arthritis, left hip(s)  The patient history, physical examination, clinical judgement of the provider and imaging studies are consistent with end stage degenerative joint disease of the left hip(s) and total hip arthroplasty is deemed medically necessary. The treatment options including medical management, injection therapy, arthroscopy and arthroplasty were discussed at length. The risks and benefits of total hip arthroplasty were presented and reviewed. The risks due to aseptic loosening, infection, stiffness, dislocation/subluxation,  thromboembolic complications and other imponderables were discussed.  The patient acknowledged the explanation, agreed to proceed with the plan and consent was signed. Patient is being admitted for inpatient treatment for surgery, pain control, PT, OT, prophylactic antibiotics, VTE prophylaxis, progressive ambulation and ADL's and discharge planning.The patient is planning to be discharged home with home health  services.      Anastasio AuerbachMatthew S. Loreley Schwall   PA-C  11/16/2014, 3:05 PM

## 2014-11-23 NOTE — Patient Instructions (Addendum)
[name]  11/23/2014   Your procedure is scheduled on: 12/03/14  Report to [facility]  Entrance and follow signs to               Short Stay Center at  12:30 PM   Call this number if you have problems the morning of surgery [phone]   Remember:  Do not eat food  :After Midnight.            MAY HAVE CLEAR LIQUIDS UNTIL 8:30 AM     CLEAR LIQUID DIET   Foods Allowed                                                                     Foods Excluded  Coffee and tea, regular and decaf                             liquids that you cannot  Plain Jell-O in any flavor                                             see through such as: Fruit ices (not with fruit pulp)                                     milk, soups, orange juice  Iced Popsicles                                    All solid food Carbonated beverages, regular and diet                                    Cranberry, grape and apple juices Sports drinks like Gatorade Lightly seasoned clear broth or consume(fat free) Sugar, honey syrup   _____________________________________________________________________       Take these medicines the morning of surgery with A SIP OF WATER: BUDESONIDE / AMLODIPINE / LIPITOR / METOPROLOL / PROTONIX / VALTREX / MAY USE EYE DROPS                              You may not have any metal on your body including hair pins and              piercings  Do not wear jewelry, make-up, lotions, powders or perfumes.             Do not wear nail polish.  Do not shave  48 hours prior to surgery.              Men may shave face and neck.   Do not bring valuables to the hospital. [place] [facility] IS NOT             RESPONSIBLE  FOR VALUABLES.  Contacts, dentures or bridgework may not be worn into surgery.  Leave suitcase in the car. After surgery it may be brought to your room.     Patients discharged the day of surgery will not be allowed to drive home.  Name  and phone number of your driver:  Special Instructions: N/A              Please read over the following fact sheets you were given: _____________________________________________________________________                                                     [place] - PREPARING FOR SURGERY  Before surgery, you can play an important role.  Because skin is not sterile, your skin needs to be as free of germs as possible.  You can reduce the number of germs on your skin by washing with CHG (chlorahexidine gluconate) soap before surgery.  CHG is an antiseptic cleaner which kills germs and bonds with the skin to continue killing germs even after washing. Please DO NOT use if you have an allergy to CHG or antibacterial soaps.  If your skin becomes reddened/irritated stop using the CHG and inform your nurse when you arrive at Short Stay. Do not shave (including legs and underarms) for at least 48 hours prior to the first CHG shower.  You may shave your face. Please follow these instructions carefully:   1.  Shower with CHG Soap the night before surgery and the  morning of Surgery.   2.  If you choose to wash your hair, wash your hair first as usual with your  normal  Shampoo.   3.  After you shampoo, rinse your hair and body thoroughly to remove the  shampoo.                                         4.  Use CHG as you would any other liquid soap.  You can apply chg directly  to the skin and wash . Gently wash with scrungie or clean wascloth    5.  Apply the CHG Soap to your body ONLY FROM THE NECK DOWN.   Do not use on open                           Wound or open sores. Avoid contact with eyes, ears mouth and genitals (private parts).                        Genitals (private parts) with your normal soap.              6.  Wash thoroughly, paying special attention to the area where your surgery  will be performed.   7.  Thoroughly rinse your body with warm water from the neck down.   8.  DO NOT  shower/wash with your normal soap after using and rinsing off  the CHG Soap .                9.  Pat yourself dry with a clean towel.             10.  Wear  clean pajamas.             11.  Place clean sheets on your bed the night of your first shower and do not  sleep with pets.  Day of Surgery : Do not apply any lotions/deodorants the morning of surgery.  Please wear clean clothes to the hospital/surgery center.  FAILURE TO FOLLOW THESE INSTRUCTIONS MAY RESULT IN THE CANCELLATION OF YOUR SURGERY    PATIENT SIGNATURE_________________________________  ______________________________________________________________________     Rogelia MireIncentive Spirometer  An incentive spirometer is a tool that can help keep your lungs clear and active. This tool measures how well you are filling your lungs with each breath. Taking long deep breaths may help reverse or decrease the chance of developing breathing (pulmonary) problems (especially infection) following:  A long period of time when you are unable to move or be active. BEFORE THE PROCEDURE   If the spirometer includes an indicator to show your best effort, your nurse or respiratory therapist will set it to a desired goal.  If possible, sit up straight or lean slightly forward. Try not to slouch.  Hold the incentive spirometer in an upright position. INSTRUCTIONS FOR USE   Sit on the edge of your bed if possible, or sit up as far as you can in bed or on a chair.  Hold the incentive spirometer in an upright position.  Breathe out normally.  Place the mouthpiece in your mouth and seal your lips tightly around it.  Breathe in slowly and as deeply as possible, raising the piston or the ball toward the top of the column.  Hold your breath for 3-5 seconds or for as long as possible. Allow the piston or ball to fall to the bottom of the column.  Remove the mouthpiece from your mouth and breathe out normally.  Rest for a few seconds and repeat  Steps 1 through 7 at least 10 times every 1-2 hours when you are awake. Take your time and take a few normal breaths between deep breaths.  The spirometer may include an indicator to show your best effort. Use the indicator as a goal to work toward during each repetition.  After each set of 10 deep breaths, practice coughing to be sure your lungs are clear. If you have an incision (the cut made at the time of surgery), support your incision when coughing by placing a pillow or rolled up towels firmly against it. Once you are able to get out of bed, walk around indoors and cough well. You may stop using the incentive spirometer when instructed by your caregiver.  RISKS AND COMPLICATIONS  Take your time so you do not get dizzy or light-headed.  If you are in pain, you may need to take or ask for pain medication before doing incentive spirometry. It is harder to take a deep breath if you are having pain. AFTER USE  Rest and breathe slowly and easily.  It can be helpful to keep track of a log of your progress. Your caregiver can provide you with a simple table to help with this. If you are using the spirometer at home, follow these instructions: SEEK MEDICAL CARE IF:   You are having difficultly using the spirometer.  You have trouble using the spirometer as often as instructed.  Your pain medication is not giving enough relief while using the spirometer.  You develop fever of 100.5 F (38.1 C) or higher. SEEK IMMEDIATE MEDICAL CARE IF:   You cough up bloody  sputum that had not been present before.  You develop fever of 102 F (38.9 C) or greater.  You develop worsening pain at or near the incision site. MAKE SURE YOU:   Understand these instructions.  Will watch your condition.  Will get help right away if you are not doing well or get worse. Document Released: 04/05/2007 Document Revised: 02/15/2012 Document Reviewed: 06/06/2007 [employer] Patient Information 2014 [employer],  [place].   ________________________________________________________________________  WHAT IS A BLOOD TRANSFUSION? Blood Transfusion Information  A transfusion is the replacement of blood or some of its parts. Blood is made up of multiple cells which provide different functions.  Red blood cells carry oxygen and are used for blood loss replacement.  White blood cells fight against infection.  Platelets control bleeding.  Plasma helps clot blood.  Other blood products are available for specialized needs, such as hemophilia or other clotting disorders. BEFORE THE TRANSFUSION  Who gives blood for transfusions?   Healthy volunteers who are fully evaluated to make sure their blood is safe. This is blood bank blood. Transfusion therapy is the safest it has ever been in the practice of medicine. Before blood is taken from a donor, a complete history is taken to make sure that person has no history of diseases nor engages in risky social behavior (examples are intravenous drug use or sexual activity with multiple partners). The donor's travel history is screened to minimize risk of transmitting infections, such as malaria. The donated blood is tested for signs of infectious diseases, such as HIV and hepatitis. The blood is then tested to be sure it is compatible with you in order to minimize the chance of a transfusion reaction. If you or a relative donates blood, this is often done in anticipation of surgery and is not appropriate for emergency situations. It takes many days to process the donated blood. RISKS AND COMPLICATIONS Although transfusion therapy is very safe and saves many lives, the main dangers of transfusion include:   Getting an infectious disease.  Developing a transfusion reaction. This is an allergic reaction to something in the blood you were given. Every precaution is taken to prevent this. The decision to have a blood transfusion has been considered carefully by your caregiver  before blood is given. Blood is not given unless the benefits outweigh the risks. AFTER THE TRANSFUSION  Right after receiving a blood transfusion, you will usually feel much better and more energetic. This is especially true if your red blood cells have gotten low (anemic). The transfusion raises the level of the red blood cells which carry oxygen, and this usually causes an energy increase.  The nurse administering the transfusion will monitor you carefully for complications. HOME CARE INSTRUCTIONS  No special instructions are needed after a transfusion. You may find your energy is better. Speak with your caregiver about any limitations on activity for underlying diseases you may have. SEEK MEDICAL CARE IF:   Your condition is not improving after your transfusion.  You develop redness or irritation at the intravenous (IV) site. SEEK IMMEDIATE MEDICAL CARE IF:  Any of the following symptoms occur over the next 12 hours:  Shaking chills.  You have a temperature by mouth above 102 F (38.9 C), not controlled by medicine.  Chest, back, or muscle pain.  People around you feel you are not acting correctly or are confused.  Shortness of breath or difficulty breathing.  Dizziness and fainting.  You get a rash or develop hives.  You have a  decrease in urine output.  Your urine turns a dark color or changes to pink, red, or brown. Any of the following symptoms occur over the next 10 days:  You have a temperature by mouth above 102 F (38.9 C), not controlled by medicine.  Shortness of breath.  Weakness after normal activity.  The white part of the eye turns yellow (jaundice).  You have a decrease in the amount of urine or are urinating less often.  Your urine turns a dark color or changes to pink, red, or brown. Document Released: 11/20/2000 Document Revised: 02/15/2012 Document Reviewed: 07/09/2008 [facility] Patient Information 2014 [place],  [place].  _______________________________________________________________________

## 2014-11-26 ENCOUNTER — Ambulatory Visit (HOSPITAL_COMMUNITY)
Admission: RE | Admit: 2014-11-26 | Discharge: 2014-11-26 | Disposition: A | Discharge status: Home / Self Care | Payer: No Typology Code available for payment source | Source: Ambulatory Visit | Attending: Anesthesiology | Admitting: Anesthesiology

## 2014-11-26 ENCOUNTER — Encounter (HOSPITAL_COMMUNITY)
Admission: RE | Admit: 2014-11-26 | Discharge: 2014-11-26 | Disposition: A | Discharge status: Home / Self Care | Payer: No Typology Code available for payment source | Source: Ambulatory Visit | Attending: Orthopedic Surgery | Admitting: Orthopedic Surgery

## 2014-11-26 ENCOUNTER — Encounter (HOSPITAL_COMMUNITY): Payer: Self-pay

## 2014-11-26 DIAGNOSIS — I10 Essential (primary) hypertension: Secondary | ICD-10-CM | POA: Insufficient documentation

## 2014-11-26 LAB — APTT: APTT: 27 s (ref 24–37)

## 2014-11-26 LAB — URINALYSIS, ROUTINE W REFLEX MICROSCOPIC
BILIRUBIN URINE: NEGATIVE
Glucose, UA: NEGATIVE mg/dL
Hgb urine dipstick: NEGATIVE
KETONES UR: NEGATIVE mg/dL
LEUKOCYTES UA: NEGATIVE
NITRITE: NEGATIVE
PROTEIN: NEGATIVE mg/dL
Specific Gravity, Urine: 1.016 (ref 1.005–1.030)
UROBILINOGEN UA: 0.2 mg/dL (ref 0.0–1.0)
pH: 6.5 (ref 5.0–8.0)

## 2014-11-26 LAB — CBC
HCT: 44.2 % (ref 36.0–46.0)
Hemoglobin: 14.9 g/dL (ref 12.0–15.0)
MCH: 31.5 pg (ref 26.0–34.0)
MCHC: 33.7 g/dL (ref 30.0–36.0)
MCV: 93.4 fL (ref 78.0–100.0)
PLATELETS: 268 10*3/uL (ref 150–400)
RBC: 4.73 MIL/uL (ref 3.87–5.11)
RDW: 12.1 % (ref 11.5–15.5)
WBC: 8 10*3/uL (ref 4.0–10.5)

## 2014-11-26 LAB — BASIC METABOLIC PANEL
ANION GAP: 12 (ref 5–15)
BUN: 16 mg/dL (ref 6–23)
CALCIUM: 10 mg/dL (ref 8.4–10.5)
CO2: 29 mEq/L (ref 19–32)
CREATININE: 0.75 mg/dL (ref 0.50–1.10)
Chloride: 99 mEq/L (ref 96–112)
GFR, EST NON AFRICAN AMERICAN: 90 mL/min — AB (ref >90)
Glucose, Bld: 105 mg/dL — ABNORMAL HIGH (ref 70–99)
Potassium: 4.3 mEq/L (ref 3.7–5.3)
Sodium: 140 mEq/L (ref 137–147)

## 2014-11-26 LAB — SURGICAL PCR SCREEN
MRSA, PCR: NEGATIVE
Staphylococcus aureus: POSITIVE — AB

## 2014-11-26 LAB — PROTIME-INR
INR: 0.96 (ref 0.00–1.49)
PROTHROMBIN TIME: 12.9 s (ref 11.6–15.2)

## 2014-12-03 ENCOUNTER — Inpatient Hospital Stay (HOSPITAL_COMMUNITY): Payer: No Typology Code available for payment source

## 2014-12-03 ENCOUNTER — Encounter (HOSPITAL_COMMUNITY)
Admission: RE | Disposition: A | Discharge status: Home / Self Care | Payer: Self-pay | Source: Ambulatory Visit | Attending: Orthopedic Surgery

## 2014-12-03 ENCOUNTER — Encounter (HOSPITAL_COMMUNITY): Payer: Self-pay | Admitting: *Deleted

## 2014-12-03 ENCOUNTER — Inpatient Hospital Stay (HOSPITAL_COMMUNITY)
Admission: RE | Admit: 2014-12-03 | Discharge: 2014-12-04 | DRG: 470 | Disposition: A | Discharge status: Home / Self Care | Payer: No Typology Code available for payment source | Source: Ambulatory Visit | Attending: Orthopedic Surgery | Admitting: Orthopedic Surgery

## 2014-12-03 ENCOUNTER — Inpatient Hospital Stay (HOSPITAL_COMMUNITY): Payer: No Typology Code available for payment source | Admitting: Anesthesiology

## 2014-12-03 DIAGNOSIS — Z96649 Presence of unspecified artificial hip joint: Secondary | ICD-10-CM

## 2014-12-03 DIAGNOSIS — E785 Hyperlipidemia, unspecified: Secondary | ICD-10-CM | POA: Diagnosis present

## 2014-12-03 DIAGNOSIS — Z90710 Acquired absence of both cervix and uterus: Secondary | ICD-10-CM

## 2014-12-03 DIAGNOSIS — I10 Essential (primary) hypertension: Secondary | ICD-10-CM | POA: Diagnosis present

## 2014-12-03 DIAGNOSIS — Z96641 Presence of right artificial hip joint: Secondary | ICD-10-CM | POA: Diagnosis present

## 2014-12-03 DIAGNOSIS — K219 Gastro-esophageal reflux disease without esophagitis: Secondary | ICD-10-CM | POA: Diagnosis present

## 2014-12-03 DIAGNOSIS — M1612 Unilateral primary osteoarthritis, left hip: Secondary | ICD-10-CM | POA: Diagnosis present

## 2014-12-03 DIAGNOSIS — M25552 Pain in left hip: Secondary | ICD-10-CM | POA: Diagnosis present

## 2014-12-03 HISTORY — PX: TOTAL HIP ARTHROPLASTY: SHX124

## 2014-12-03 LAB — TYPE AND SCREEN
ABO/RH(D): A POS
Antibody Screen: NEGATIVE

## 2014-12-03 SURGERY — ARTHROPLASTY, HIP, TOTAL, ANTERIOR APPROACH
Anesthesia: Spinal | Site: Hip | Laterality: Left

## 2014-12-03 MED ORDER — PROPOFOL 10 MG/ML IV BOLUS
INTRAVENOUS | Status: AC
  Filled 2014-12-03: qty 20

## 2014-12-03 MED ORDER — MIDAZOLAM HCL 5 MG/5ML IJ SOLN
INTRAMUSCULAR | Status: DC | PRN
  Administered 2014-12-03: 2 mg via INTRAVENOUS

## 2014-12-03 MED ORDER — ATORVASTATIN CALCIUM 10 MG PO TABS
10.0000 mg | ORAL_TABLET | Freq: Every morning | ORAL | Status: DC
  Administered 2014-12-04: 10 mg via ORAL
  Filled 2014-12-03: qty 1

## 2014-12-03 MED ORDER — DOCUSATE SODIUM 100 MG PO CAPS
100.0000 mg | ORAL_CAPSULE | Freq: Two times a day (BID) | ORAL | Status: DC
  Administered 2014-12-03 – 2014-12-04 (×2): 100 mg via ORAL

## 2014-12-03 MED ORDER — CYCLOSPORINE 0.05 % OP EMUL
1.0000 [drp] | Freq: Two times a day (BID) | OPHTHALMIC | Status: DC
  Administered 2014-12-04: 1 [drp] via OPHTHALMIC
  Filled 2014-12-03 (×3): qty 1

## 2014-12-03 MED ORDER — METHOCARBAMOL 1000 MG/10ML IJ SOLN
500.0000 mg | Freq: Four times a day (QID) | INTRAVENOUS | Status: DC | PRN
  Administered 2014-12-03: 500 mg via INTRAVENOUS
  Filled 2014-12-03 (×2): qty 5

## 2014-12-03 MED ORDER — CEFAZOLIN SODIUM-DEXTROSE 2-3 GM-% IV SOLR
2.0000 g | INTRAVENOUS | Status: AC
  Administered 2014-12-03: 2 g via INTRAVENOUS

## 2014-12-03 MED ORDER — MIDAZOLAM HCL 2 MG/2ML IJ SOLN
INTRAMUSCULAR | Status: AC
  Filled 2014-12-03: qty 2

## 2014-12-03 MED ORDER — METHOCARBAMOL 500 MG PO TABS
500.0000 mg | ORAL_TABLET | Freq: Four times a day (QID) | ORAL | Status: DC | PRN
  Administered 2014-12-03 – 2014-12-04 (×2): 500 mg via ORAL
  Filled 2014-12-03 (×2): qty 1

## 2014-12-03 MED ORDER — DIPHENHYDRAMINE HCL 25 MG PO CAPS: 25.0000 mg | ORAL_CAPSULE | Freq: Four times a day (QID) | ORAL | Status: DC | PRN

## 2014-12-03 MED ORDER — BUPIVACAINE HCL (PF) 0.5 % IJ SOLN
INTRAMUSCULAR | Status: DC | PRN
  Administered 2014-12-03: 3 mL

## 2014-12-03 MED ORDER — MENTHOL 3 MG MT LOZG
1.0000 | LOZENGE | OROMUCOSAL | Status: DC | PRN
  Filled 2014-12-03: qty 9

## 2014-12-03 MED ORDER — ALUM & MAG HYDROXIDE-SIMETH 200-200-20 MG/5ML PO SUSP: 30.0000 mL | ORAL | Status: DC | PRN

## 2014-12-03 MED ORDER — PROPOFOL INFUSION 10 MG/ML OPTIME
INTRAVENOUS | Status: DC | PRN
Start: 2014-12-03 — End: 2014-12-03
  Administered 2014-12-03: 100 ug/kg/min via INTRAVENOUS

## 2014-12-03 MED ORDER — METOCLOPRAMIDE HCL 10 MG PO TABS
5.0000 mg | ORAL_TABLET | Freq: Three times a day (TID) | ORAL | Status: DC | PRN
Start: 2014-12-03 — End: 2014-12-04

## 2014-12-03 MED ORDER — VALACYCLOVIR HCL 500 MG PO TABS
500.0000 mg | ORAL_TABLET | Freq: Every day | ORAL | Status: DC
  Administered 2014-12-04: 500 mg via ORAL
  Filled 2014-12-03: qty 1

## 2014-12-03 MED ORDER — HYDROMORPHONE HCL 1 MG/ML IJ SOLN
INTRAMUSCULAR | Status: AC
  Filled 2014-12-03: qty 1

## 2014-12-03 MED ORDER — HYDROMORPHONE HCL 1 MG/ML IJ SOLN
0.5000 mg | INTRAMUSCULAR | Status: DC | PRN
  Administered 2014-12-03: 1 mg via INTRAVENOUS
  Filled 2014-12-03: qty 1

## 2014-12-03 MED ORDER — METOCLOPRAMIDE HCL 5 MG/ML IJ SOLN: 5.0000 mg | Freq: Three times a day (TID) | INTRAMUSCULAR | Status: DC | PRN

## 2014-12-03 MED ORDER — ASPIRIN EC 325 MG PO TBEC
325.0000 mg | DELAYED_RELEASE_TABLET | Freq: Two times a day (BID) | ORAL | Status: DC
  Administered 2014-12-04: 325 mg via ORAL
  Filled 2014-12-03 (×3): qty 1

## 2014-12-03 MED ORDER — CHLORHEXIDINE GLUCONATE 4 % EX LIQD: 60.0000 mL | Freq: Once | CUTANEOUS | Status: DC

## 2014-12-03 MED ORDER — ONDANSETRON HCL 4 MG/2ML IJ SOLN
INTRAMUSCULAR | Status: AC
  Filled 2014-12-03: qty 2

## 2014-12-03 MED ORDER — ONDANSETRON HCL 4 MG PO TABS
4.0000 mg | ORAL_TABLET | Freq: Four times a day (QID) | ORAL | Status: DC | PRN
Start: 2014-12-03 — End: 2014-12-04

## 2014-12-03 MED ORDER — DEXAMETHASONE SODIUM PHOSPHATE 10 MG/ML IJ SOLN
INTRAMUSCULAR | Status: AC
  Filled 2014-12-03: qty 1

## 2014-12-03 MED ORDER — BUPIVACAINE HCL (PF) 0.5 % IJ SOLN
INTRAMUSCULAR | Status: AC
  Filled 2014-12-03: qty 30

## 2014-12-03 MED ORDER — PHENOL 1.4 % MT LIQD
1.0000 | OROMUCOSAL | Status: DC | PRN
  Filled 2014-12-03: qty 177

## 2014-12-03 MED ORDER — FENTANYL CITRATE 0.05 MG/ML IJ SOLN
INTRAMUSCULAR | Status: DC | PRN
  Administered 2014-12-03 (×2): 50 ug via INTRAVENOUS

## 2014-12-03 MED ORDER — ONDANSETRON HCL 4 MG/2ML IJ SOLN
INTRAMUSCULAR | Status: DC | PRN
  Administered 2014-12-03: 4 mg via INTRAVENOUS

## 2014-12-03 MED ORDER — SODIUM CHLORIDE 0.9 % IV SOLN
100.0000 mL/h | INTRAVENOUS | Status: DC
  Administered 2014-12-03: 100 mL/h via INTRAVENOUS
  Filled 2014-12-03 (×3): qty 1000

## 2014-12-03 MED ORDER — LACTATED RINGERS IV SOLN
INTRAVENOUS | Status: DC | PRN
  Administered 2014-12-03 (×3): via INTRAVENOUS

## 2014-12-03 MED ORDER — EPHEDRINE SULFATE 50 MG/ML IJ SOLN
INTRAMUSCULAR | Status: DC | PRN
  Administered 2014-12-03: 10 mg via INTRAVENOUS
  Administered 2014-12-03: 5 mg via INTRAVENOUS

## 2014-12-03 MED ORDER — LACTATED RINGERS IV SOLN: INTRAVENOUS | Status: DC

## 2014-12-03 MED ORDER — ZOLPIDEM TARTRATE 5 MG PO TABS
5.0000 mg | ORAL_TABLET | Freq: Every evening | ORAL | Status: DC | PRN
  Administered 2014-12-03: 5 mg via ORAL
  Filled 2014-12-03: qty 1

## 2014-12-03 MED ORDER — DEXAMETHASONE SODIUM PHOSPHATE 10 MG/ML IJ SOLN
10.0000 mg | Freq: Once | INTRAMUSCULAR | Status: AC
  Administered 2014-12-03: 10 mg via INTRAVENOUS

## 2014-12-03 MED ORDER — BISACODYL 10 MG RE SUPP: 10.0000 mg | Freq: Every day | RECTAL | Status: DC | PRN

## 2014-12-03 MED ORDER — TRANEXAMIC ACID 100 MG/ML IV SOLN
1000.0000 mg | Freq: Once | INTRAVENOUS | Status: AC
  Administered 2014-12-03: 1000 mg via INTRAVENOUS
  Filled 2014-12-03: qty 10

## 2014-12-03 MED ORDER — BUDESONIDE 3 MG PO CP24
9.0000 mg | ORAL_CAPSULE | Freq: Every day | ORAL | Status: DC
  Administered 2014-12-04: 9 mg via ORAL
  Filled 2014-12-03: qty 3

## 2014-12-03 MED ORDER — ONDANSETRON HCL 4 MG/2ML IJ SOLN: 4.0000 mg | Freq: Four times a day (QID) | INTRAMUSCULAR | Status: DC | PRN

## 2014-12-03 MED ORDER — CELECOXIB 200 MG PO CAPS
200.0000 mg | ORAL_CAPSULE | Freq: Two times a day (BID) | ORAL | Status: DC
  Administered 2014-12-03 – 2014-12-04 (×2): 200 mg via ORAL
  Filled 2014-12-03 (×3): qty 1

## 2014-12-03 MED ORDER — FENTANYL CITRATE 0.05 MG/ML IJ SOLN
INTRAMUSCULAR | Status: AC
  Filled 2014-12-03: qty 2

## 2014-12-03 MED ORDER — CEFAZOLIN SODIUM-DEXTROSE 2-3 GM-% IV SOLR
2.0000 g | Freq: Four times a day (QID) | INTRAVENOUS | Status: AC
  Administered 2014-12-03 – 2014-12-04 (×2): 2 g via INTRAVENOUS
  Filled 2014-12-03 (×2): qty 50

## 2014-12-03 MED ORDER — CEFAZOLIN SODIUM-DEXTROSE 2-3 GM-% IV SOLR
INTRAVENOUS | Status: AC
  Filled 2014-12-03: qty 50

## 2014-12-03 MED ORDER — DEXAMETHASONE SODIUM PHOSPHATE 10 MG/ML IJ SOLN
10.0000 mg | Freq: Once | INTRAMUSCULAR | Status: DC
  Filled 2014-12-03: qty 1

## 2014-12-03 MED ORDER — AMLODIPINE BESYLATE 10 MG PO TABS
10.0000 mg | ORAL_TABLET | Freq: Every morning | ORAL | Status: DC
Start: 2014-12-04 — End: 2014-12-04
  Filled 2014-12-03: qty 1

## 2014-12-03 MED ORDER — POLYETHYLENE GLYCOL 3350 17 G PO PACK
17.0000 g | PACK | Freq: Two times a day (BID) | ORAL | Status: DC
  Administered 2014-12-04: 17 g via ORAL

## 2014-12-03 MED ORDER — PROPOFOL 10 MG/ML IV BOLUS
INTRAVENOUS | Status: DC | PRN
  Administered 2014-12-03 (×2): 20 mg via INTRAVENOUS

## 2014-12-03 MED ORDER — FERROUS SULFATE 325 (65 FE) MG PO TABS
325.0000 mg | ORAL_TABLET | Freq: Three times a day (TID) | ORAL | Status: DC
  Administered 2014-12-04 (×2): 325 mg via ORAL
  Filled 2014-12-03 (×4): qty 1

## 2014-12-03 MED ORDER — METOPROLOL SUCCINATE ER 50 MG PO TB24
50.0000 mg | ORAL_TABLET | Freq: Every morning | ORAL | Status: DC
  Filled 2014-12-03: qty 1

## 2014-12-03 MED ORDER — PANTOPRAZOLE SODIUM 40 MG PO TBEC
40.0000 mg | DELAYED_RELEASE_TABLET | Freq: Every morning | ORAL | Status: DC
  Administered 2014-12-04: 40 mg via ORAL
  Filled 2014-12-03: qty 1

## 2014-12-03 MED ORDER — HYDROCHLOROTHIAZIDE 25 MG PO TABS
25.0000 mg | ORAL_TABLET | Freq: Every morning | ORAL | Status: DC
  Filled 2014-12-03: qty 1

## 2014-12-03 MED ORDER — SODIUM CHLORIDE 0.9 % IR SOLN
Status: DC | PRN
  Administered 2014-12-03: 1000 mL

## 2014-12-03 MED ORDER — HYDROCODONE-ACETAMINOPHEN 7.5-325 MG PO TABS
1.0000 | ORAL_TABLET | ORAL | Status: DC
  Administered 2014-12-03 – 2014-12-04 (×5): 2 via ORAL
  Filled 2014-12-03 (×5): qty 2

## 2014-12-03 MED ORDER — MAGNESIUM CITRATE PO SOLN: 1.0000 | Freq: Once | ORAL | Status: AC | PRN

## 2014-12-03 MED ORDER — HYDROMORPHONE HCL 1 MG/ML IJ SOLN
0.2500 mg | INTRAMUSCULAR | Status: DC | PRN
  Administered 2014-12-03 (×2): 0.5 mg via INTRAVENOUS

## 2014-12-03 SURGICAL SUPPLY — 41 items
ADH SKN CLS APL DERMABOND .7 (GAUZE/BANDAGES/DRESSINGS) ×1
BAG SPEC THK2 15X12 ZIP CLS (MISCELLANEOUS)
BAG ZIPLOCK 12X15 (MISCELLANEOUS) IMPLANT
CAPT HIP TOTAL 2 ×2 IMPLANT
COVER PERINEAL POST (MISCELLANEOUS) ×3 IMPLANT
DERMABOND ADVANCED (GAUZE/BANDAGES/DRESSINGS) ×2
DERMABOND ADVANCED .7 DNX12 (GAUZE/BANDAGES/DRESSINGS) IMPLANT
DRAPE C-ARM 42X120 X-RAY (DRAPES) ×3 IMPLANT
DRAPE STERI IOBAN 125X83 (DRAPES) ×3 IMPLANT
DRAPE U-SHAPE 47X51 STRL (DRAPES) ×9 IMPLANT
DRSG AQUACEL AG ADV 3.5X10 (GAUZE/BANDAGES/DRESSINGS) ×3 IMPLANT
DURAPREP 26ML APPLICATOR (WOUND CARE) ×3 IMPLANT
ELECT BLADE TIP CTD 4 INCH (ELECTRODE) ×3 IMPLANT
ELECT PENCIL ROCKER SW 15FT (MISCELLANEOUS) IMPLANT
ELECT REM PT RETURN 15FT ADLT (MISCELLANEOUS) IMPLANT
ELECT REM PT RETURN 9FT ADLT (ELECTROSURGICAL) ×3
ELECTRODE REM PT RTRN 9FT ADLT (ELECTROSURGICAL) ×1 IMPLANT
FACESHIELD WRAPAROUND (MASK) ×12 IMPLANT
FACESHIELD WRAPAROUND OR TEAM (MASK) ×4 IMPLANT
GLOVE BIOGEL PI IND STRL 7.5 (GLOVE) ×1 IMPLANT
GLOVE BIOGEL PI IND STRL 8.5 (GLOVE) ×1 IMPLANT
GLOVE BIOGEL PI INDICATOR 7.5 (GLOVE) ×2
GLOVE BIOGEL PI INDICATOR 8.5 (GLOVE) ×2
GLOVE ECLIPSE 8.0 STRL XLNG CF (GLOVE) ×6 IMPLANT
GLOVE ORTHO TXT STRL SZ7.5 (GLOVE) ×3 IMPLANT
GOWN SPEC L3 XXLG W/TWL (GOWN DISPOSABLE) ×3 IMPLANT
GOWN STRL REUS W/TWL LRG LVL3 (GOWN DISPOSABLE) ×3 IMPLANT
HOLDER FOLEY CATH W/STRAP (MISCELLANEOUS) ×3 IMPLANT
KIT BASIN OR (CUSTOM PROCEDURE TRAY) ×3 IMPLANT
LIQUID BAND (GAUZE/BANDAGES/DRESSINGS) ×3 IMPLANT
PACK TOTAL JOINT (CUSTOM PROCEDURE TRAY) ×3 IMPLANT
SAW OSC TIP CART 19.5X105X1.3 (SAW) ×3 IMPLANT
SUT MNCRL AB 4-0 PS2 18 (SUTURE) ×3 IMPLANT
SUT VIC AB 1 CT1 36 (SUTURE) ×9 IMPLANT
SUT VIC AB 2-0 CT1 27 (SUTURE) ×6
SUT VIC AB 2-0 CT1 TAPERPNT 27 (SUTURE) ×2 IMPLANT
SUT VLOC 180 0 24IN GS25 (SUTURE) ×3 IMPLANT
TOWEL OR 17X26 10 PK STRL BLUE (TOWEL DISPOSABLE) ×3 IMPLANT
TOWEL OR NON WOVEN STRL DISP B (DISPOSABLE) IMPLANT
TRAY FOLEY CATH 14FRSI W/METER (CATHETERS) ×3 IMPLANT
WATER STERILE IRR 1500ML POUR (IV SOLUTION) ×3 IMPLANT

## 2014-12-03 NOTE — Op Note (Signed)
NAME:  [name] NO.: [id number]      MEDICAL RECORD NO.: [id number]      FACILITY:  [facility]      PHYSICIAN:  Durene Romans D  DATE OF BIRTH:  [birth year]     DATE OF PROCEDURE:  12/03/2014                                 OPERATIVE REPORT         PREOPERATIVE DIAGNOSIS: Left  hip osteoarthritis.      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 (Matching other side components)     SURGEON:  Madlyn Frankel. Charlann Boxer, M.D.      ASSISTANT:  Velta Addison, RN     ANESTHESIA:  Spinal.      SPECIMENS:  None.      COMPLICATIONS:  None.      BLOOD LOSS:  300cc     DRAINS:  None.      INDICATION OF THE PROCEDURE:  [name] is a 63 y.o. female who had   presented to office for evaluation of left hip pain.  Radiographs revealed   progressive degenerative changes with bone-on-bone   articulation to the  hip joint.  The patient had painful limited range of   motion significantly affecting their overall quality of life.  The patient was failing to    respond to conservative measures, and at this point was ready   to proceed with more definitive measures.  The patient has noted progressive   degenerative changes in his hip, progressive problems and dysfunction   with regarding the hip prior to surgery.  Consent was obtained for   benefit of pain relief.  Specific risk of infection, DVT, component   failure, dislocation, need for revision surgery, as well discussion of   the anterior versus posterior approach were reviewed.  Consent was   obtained for benefit of anterior pain relief through an anterior   approach.      PROCEDURE IN DETAIL:  The patient was  brought to operative theater.   Once adequate anesthesia, preoperative antibiotics, 2gm of Ancef administered.   The patient was positioned supine on the OSI Hanna table.  Once adequate   padding of boney process was carried out, we had predraped out the hip, and  used fluoroscopy to confirm orientation of the pelvis and position.      The left hip was then prepped and draped from proximal iliac crest to   mid thigh with shower curtain technique.      Time-out was performed identifying the patient, planned procedure, and   extremity.     An incision was then made 2 cm distal and lateral to the   anterior superior iliac spine extending over the orientation of the   tensor fascia lata muscle and sharp dissection was carried down to the   fascia of the muscle and protractor placed in the soft tissues.      The fascia was then incised.  The muscle belly was identified and swept   laterally and retractor placed along the superior neck.  Following   cauterization of the circumflex vessels and removing some pericapsular   fat, a second cobra retractor was placed on the inferior neck.  A third   retractor was placed on the anterior acetabulum after elevating the   anterior rectus.  A L-capsulotomy was along the line of the   superior neck to the trochanteric fossa, then extended proximally and   distally.  Tag sutures were placed and the retractors were then placed   intracapsular.  We then identified the trochanteric fossa and   orientation of my neck cut, confirmed this radiographically   and then made a neck osteotomy with the femur on traction.  The femoral   head was removed without difficulty or complication.  Traction was let   off and retractors were placed posterior and anterior around the   acetabulum.      The labrum and foveal tissue were debrided.  I began reaming with a 43mm   reamer and reamed up to 47mm reamer with good bony bed preparation and a 48mm   cup was chosen.  The  final 48mm Gription Pinnacle cup was then impacted under fluoroscopy  to confirm the depth of penetration and orientation with respect to   abduction.  A screw was placed followed by the hole eliminator.  The final   32+4 neutral Altrex liner was impacted with good visualized rim fit.  The cup was positioned anatomically within the acetabular portion of the pelvis.      At this point, the femur was rolled at 80 degrees.  Further capsule was   released off the inferior aspect of the femoral neck.  I then   released the superior capsule proximally.  The hook was placed laterally   along the femur and elevated manually and held in position with the bed   hook.  The leg was then extended and adducted with the leg rolled to 100   degrees of external rotation.  Once the proximal femur was fully   exposed, I used a box osteotome to set orientation.  I then began   broaching with the starting chili pepper broach and passed this by hand and then broached up to 2.  With the 2 broach in place I chose a high offset neck and did a trial reduction witht the 32+5 head ball to match the other hip.  The offset was appropriate, leg lengths   appeared to be equal, confirmed radiographically.   Given these findings, I went ahead and dislocated the hip, repositioned all   retractors and positioned the right hip in the extended and abducted position.  The final 2 Hi Tri Lock stem was   chosen and it was impacted down to the level of neck cut.  Based on this   and the trial reduction, a 32+5 delta ceramic ball was chosen and   impacted onto a clean and dry trunnion, and the hip was reduced.  The   hip had been irrigated throughout the case again at this point.  I did   reapproximate the superior capsular leaflet to the anterior leaflet   using #1 Vicryl.  The fascia of the   tensor fascia lata muscle was then reapproximated using #1 Vicryl and #0 V-lock sutures.  The   remaining wound was closed with 2-0 Vicryl and  running 4-0 Monocryl.   The hip was cleaned, dried,  and dressed sterilely using Dermabond and   Aquacel dressing.  She was then brought   to recovery room in stable condition tolerating the procedure well.              Madlyn FrankelMatthew D. Charlann Boxerlin, M.D.        12/03/2014 3:54 PM

## 2014-12-03 NOTE — Interval H&P Note (Signed)
History and Physical Interval Note:  12/03/2014 12:43 PM  [name]  has presented today for surgery, with the diagnosis of left hip oa   The various methods of treatment have been discussed with the patient and family. After consideration of risks, benefits and other options for treatment, the patient has consented to  Procedure(s): LEFT TOTAL HIP ARTHROPLASTY ANTERIOR APPROACH (Left) as a surgical intervention .  The patient's history has been reviewed, patient examined, no change in status, stable for surgery.  I have reviewed the patient's chart and labs.  Questions were answered to the patient's satisfaction.     Shelda PalLIN,Lantz Hermann D

## 2014-12-03 NOTE — Plan of Care (Signed)
Problem: Consults Goal: Diagnosis- Total Joint Replacement Left anterior hip     

## 2014-12-03 NOTE — Anesthesia Preprocedure Evaluation (Addendum)
Anesthesia Evaluation  Patient identified by MRN, date of birth, ID band Patient awake    Reviewed: Allergy & Precautions, H&P , NPO status , Patient's Chart, lab work & pertinent test results  History of Anesthesia Complications (+) PONV and history of anesthetic complications  Airway Mallampati: II  TM Distance: >3 FB Neck ROM: Full    Dental no notable dental hx. (+) Teeth Intact, Dental Advisory Given   Pulmonary neg pulmonary ROS,  breath sounds clear to auscultation  Pulmonary exam normal       Cardiovascular hypertension, Pt. on medications and Pt. on home beta blockers Rhythm:Regular Rate:Normal     Neuro/Psych negative neurological ROS  negative psych ROS   GI/Hepatic Neg liver ROS, GERD-  Medicated,  Endo/Other  negative endocrine ROS  Renal/GU negative Renal ROS     Musculoskeletal negative musculoskeletal ROS (+)   Abdominal   Peds  Hematology negative hematology ROS (+)   Anesthesia Other Findings   Reproductive/Obstetrics negative OB ROS                            Anesthesia Physical Anesthesia Plan  ASA: II  Anesthesia Plan: Spinal   Post-op Pain Management:    Induction:   Airway Management Planned: Simple Face Mask  Additional Equipment:   Intra-op Plan:   Post-operative Plan:   Informed Consent:   Plan Discussed with: Surgeon  Anesthesia Plan Comments:         Anesthesia Quick Evaluation

## 2014-12-03 NOTE — Transfer of Care (Signed)
Immediate Anesthesia Transfer of Care Note  Patient: [name]  Procedure(s) Performed: Procedure(s): LEFT TOTAL HIP ARTHROPLASTY ANTERIOR APPROACH (Left)  Patient Location: PACU  Anesthesia Type:Regional  Level of Consciousness: awake, alert  and oriented  Airway & Oxygen Therapy: Patient Spontanous Breathing and Patient connected to face mask oxygen  Post-op Assessment: Report given to PACU RN and Post -op Vital signs reviewed and stable  Post vital signs: Reviewed and stable  Complications: No apparent anesthesia complications

## 2014-12-03 NOTE — Anesthesia Procedure Notes (Signed)
Spinal Patient location during procedure: OR Start time: 12/03/2014 2:10 PM End time: 12/03/2014 2:14 PM Staffing Anesthesiologist: EWELL, CHARLES L Resident/CRNA: ,  E Performed by: resident/CRNA  Preanesthetic Checklist Completed: patient identified, site marked, surgical consent, pre-op evaluation, timeout performed, IV checked, risks and benefits discussed and monitors and equipment checked Spinal Block Patient position: sitting Prep: Betadine Patient monitoring: heart rate, continuous pulse ox and blood pressure Approach: midline Location: L3-4 Injection technique: single-shot Needle Needle type: Sprotte  Needle gauge: 24 G Needle length: 10 cm Assessment Sensory level: T6 Additional Notes Expiration date of kit checked and confirmed. Clear CSF pre/post injection, neg heme. Patient tolerated procedure well, without complications.     

## 2014-12-04 ENCOUNTER — Encounter (HOSPITAL_COMMUNITY): Payer: Self-pay | Admitting: Orthopedic Surgery

## 2014-12-04 LAB — CBC
HEMATOCRIT: 36.3 % (ref 36.0–46.0)
HEMOGLOBIN: 12.4 g/dL (ref 12.0–15.0)
MCH: 31.4 pg (ref 26.0–34.0)
MCHC: 34.2 g/dL (ref 30.0–36.0)
MCV: 91.9 fL (ref 78.0–100.0)
PLATELETS: 193 10*3/uL (ref 150–400)
RBC: 3.95 MIL/uL (ref 3.87–5.11)
RDW: 12.1 % (ref 11.5–15.5)
WBC: 10.9 10*3/uL — AB (ref 4.0–10.5)

## 2014-12-04 LAB — BASIC METABOLIC PANEL
ANION GAP: 6 (ref 5–15)
BUN: 13 mg/dL (ref 6–23)
CALCIUM: 8.3 mg/dL — AB (ref 8.4–10.5)
CHLORIDE: 105 meq/L (ref 96–112)
CO2: 26 mmol/L (ref 19–32)
CREATININE: 0.64 mg/dL (ref 0.50–1.10)
GFR calc Af Amer: >90 mL/min (ref >90)
GFR calc non Af Amer: >90 mL/min (ref >90)
GLUCOSE: 141 mg/dL — AB (ref 70–99)
Potassium: 3.9 mmol/L (ref 3.5–5.1)
Sodium: 137 mmol/L (ref 135–145)

## 2014-12-04 MED ORDER — ASPIRIN 325 MG PO TBEC: 325.0000 mg | DELAYED_RELEASE_TABLET | Freq: Two times a day (BID) | ORAL | Status: AC

## 2014-12-04 MED ORDER — POLYETHYLENE GLYCOL 3350 17 G PO PACK: 17.0000 g | PACK | Freq: Two times a day (BID) | ORAL | Status: DC

## 2014-12-04 MED ORDER — METHOCARBAMOL 500 MG PO TABS
500.0000 mg | ORAL_TABLET | Freq: Four times a day (QID) | ORAL | Status: DC | PRN
Start: 2014-12-04 — End: 2017-08-10

## 2014-12-04 MED ORDER — FERROUS SULFATE 325 (65 FE) MG PO TABS: 325.0000 mg | ORAL_TABLET | Freq: Three times a day (TID) | ORAL | Status: AC

## 2014-12-04 MED ORDER — HYDROCODONE-ACETAMINOPHEN 7.5-325 MG PO TABS: 1.0000 | ORAL_TABLET | ORAL | Status: DC | PRN

## 2014-12-04 NOTE — Progress Notes (Signed)
Patient ID: [name], female   DOB: [birth year], 63 y.o.   MRN: [id number] Subjective: 1 Day Post-Op Procedure(s) (LRB): LEFT TOTAL HIP ARTHROPLASTY ANTERIOR APPROACH (Left)    Patient reports pain as mild.  Pain controlled, no events over night  Objective:   VITALS:   Filed Vitals:   12/04/14 1007  BP: 112/59  Pulse: 53  Temp: 97.8 F (36.6 C)  Resp: 14    Neurovascular intact Incision: dressing C/D/I  LABS  Recent Labs  12/04/14 0456  HGB 12.4  HCT 36.3  WBC 10.9*  PLT 193     Recent Labs  12/04/14 0456  NA 137  K 3.9  BUN 13  CREATININE 0.64  GLUCOSE 141*    No results for input(s): LABPT, INR in the last 72 hours.   Assessment/Plan: 1 Day Post-Op Procedure(s) (LRB): LEFT TOTAL HIP ARTHROPLASTY ANTERIOR APPROACH (Left)   Advance diet Up with therapy Discharge home with home health after therapy today  RTC in weeks Reviewed post op course and plan

## 2014-12-04 NOTE — Evaluation (Signed)
Occupational Therapy Evaluation Patient Details Name: [name] MRN: [id number] DOB: Jan 16, 1951 Today's Date: 12/04/2014    History of Present Illness L DA THA; PMHx: R post THA   Clinical Impression   Pt doing well and is overall min guard assist with ADL except min assist LB self care. Husband can assist with LB ADL as pt not interested in AE at this time. All education completed and pt and spouse with no further OT needs/questions. Will sign off for acute OT.    Follow Up Recommendations  No OT follow up;Supervision/Assistance - 24 hour    Equipment Recommendations  3 in 1 bedside comode    Recommendations for Other Services       Precautions / Restrictions Restrictions Weight Bearing Restrictions: No Other Position/Activity Restrictions: WBAT      Mobility Bed Mobility            Transfers Overall transfer level: Needs assistance Equipment used: Rolling walker (2 wheeled) Transfers: Sit to/from Stand Sit to Stand: Min guard         General transfer comment: cues for safety and hand placement    Balance                                            ADL Overall ADL's : Needs assistance/impaired Eating/Feeding: Independent;Sitting   Grooming: Wash/dry hands;Min guard;Standing   Upper Body Bathing: Set up;Sitting   Lower Body Bathing: Minimal assistance;Sit to/from stand   Upper Body Dressing : Set up;Sitting   Lower Body Dressing: Minimal assistance;Sit to/from stand   Toilet Transfer: Min guard;Ambulation;BSC;RW   Toileting- Architect and Hygiene: Min guard;Sit to/from stand   Tub/ Shower Transfer: Walk-in shower;Min Systems analyst ADL Comments: husband present for session and pt states he can assist with LB dresing till pt is able. Reviewed sequence for LB dressing and having walker in front of her when she stands. Educated on how to adjust 3in1 for appropriate height and use as BSC at night  if desired. Pt did well with shower transfer and educated husband on how to stabilize walker.     Vision                     Perception     Praxis      Pertinent Vitals/Pain Pain Assessment: 0-10 Pain Score: 2  Pain Location: L hip Pain Descriptors / Indicators: Sore Pain Intervention(s): Repositioned;Ice applied     Hand Dominance     Extremity/Trunk Assessment Upper Extremity Assessment Upper Extremity Assessment: Overall WFL for tasks assessed          Communication Communication Communication: No difficulties   Cognition Arousal/Alertness: Awake/alert Behavior During Therapy: WFL for tasks assessed/performed Overall Cognitive Status: Within Functional Limits for tasks assessed                     General Comments       Exercises       Shoulder Instructions      Home Living Family/patient expects to be discharged to:: Private residence Living Arrangements: Spouse/significant other   Type of Home: House Home Access: Stairs to enter [employer] of Steps: 4 Entrance Stairs-Rails: Right Home Layout: Two level Alternate Level Stairs-Number of Steps: 12   Bathroom Shower/Tub: Producer, television/film/video: Standard  Home Equipment: Walker - 2 wheels;Crutches;Shower seat   Additional Comments: has 2 walkers, keeps one upstairs, one downstairs      Prior Functioning/Environment Level of Independence: Independent             OT Diagnosis: Generalized weakness   OT Problem List:     OT Treatment/Interventions:      OT Goals(Current goals can be found in the care plan section) Acute Rehab OT Goals Patient Stated Goal: return to I OT Goal Formulation: With patient  OT Frequency:     Barriers to D/C:            Co-evaluation              End of Session Equipment Utilized During Treatment: Rolling walker  Activity Tolerance: Patient tolerated treatment well Patient left: in chair;with call  bell/phone within reach;with family/visitor present   Time: 0981-1914 OT Time Calculation (min): 15 min Charges:  OT General Charges $OT Visit: 1 Procedure OT Evaluation $Initial OT Evaluation Tier I: 1 Procedure OT Treatments $Therapeutic Activity: 8-22 mins G-Codes:    Lennox Laity  [phone] 12/04/2014, 11:47 AM

## 2014-12-04 NOTE — Care Management Note (Signed)
    Page 1 of 2   12/04/2014     3:47:56 PM CARE MANAGEMENT NOTE 12/04/2014  Patient:  [name], [name]   Account Number:  [id number]  Date Initiated:  12/04/2014  Documentation initiated by:  [facility]  Subjective/Objective Assessment:   adm: Procedure(s) Performed: Procedure(s) (LRB):  LEFT TOTAL HIP ARTHROPLASTY ANTERIOR APPROACH (Left)     Action/Plan:   discharge planning   Anticipated DC Date:  12/04/2014   Anticipated DC Plan:  [place]  CM consult      [facility] Choice  HOME HEALTH   Choice offered to / List presented to:  C-1 Patient   DME arranged  3-N-1      DME agency  [place] arranged  [place]   Status of service:  Completed, signed off Medicare Important Message given?   (If response is "NO", the following Medicare IM given date fields will be blank) Date Medicare IM given:   Medicare IM given by:   Date Additional Medicare IM given:   Additional Medicare IM given by:    Discharge Disposition:  [place]  Per UR Regulation:    If discussed at Long Length of Stay Meetings, dates discussed:    Comments:  12/04/14 08;00 CM met with pt in room to offer choice of home health agency.  Pt chooses [facility] to render HHPT. Address andcontact information verified with pt.  Referral given to [facility] rep, Tim (on unit).  CM called DME rep, Lecretia to please deliver 3n1 to room prior to discharge. No other CM needs were communicated.  Mariane Masters, BSN, CM [phone].

## 2014-12-04 NOTE — Evaluation (Signed)
Physical Therapy Evaluation Patient Details Name: [name] MRN: [id number] DOB: March 31, 1951 Today's Date: 12/04/2014   History of Present Illness  L DA THA; PMHx: R post THA  Clinical Impression  Pt will benefit from PT to address deficits below; Will practice stairs next session, prior to D/C    Follow Up Recommendations Home health PT    Equipment Recommendations  None recommended by PT    Recommendations for Other Services       Precautions / Restrictions Restrictions Weight Bearing Restrictions: No Other Position/Activity Restrictions: WBAT      Mobility  Bed Mobility Overal bed mobility: Needs Assistance Bed Mobility: Supine to Sit     Supine to sit: Min assist     General bed mobility comments: min with LLE  Transfers Overall transfer level: Needs assistance Equipment used: Rolling walker (2 wheeled) Transfers: Sit to/from Stand Sit to Stand: Min guard         General transfer comment: cues for safety and hand placement  Ambulation/Gait Ambulation/Gait assistance: Min guard Ambulation Distance (Feet): 80 Feet Assistive device: Rolling walker (2 wheeled) Gait Pattern/deviations: Step-to pattern;Step-through pattern;Antalgic     General Gait Details: cues for sequence and to push RW  Stairs            Wheelchair Mobility    Modified Rankin (Stroke Patients Only)       Balance                                             Pertinent Vitals/Pain Pain Assessment: 0-10 Pain Score: 0-No pain Pain Location: pain to touch thigh    Home Living Family/patient expects to be discharged to:: Private residence Living Arrangements: Spouse/significant other   Type of Home: House Home Access: Stairs to enter Entrance Stairs-Rails: Right Entrance Stairs-Number of Steps: 4 Home Layout: Two level Home Equipment: Environmental consultant - 2 wheels;Crutches Additional Comments: has 2 walkers, keeps one upstairs, one downstairs    Prior  Function Level of Independence: Independent               Hand Dominance        Extremity/Trunk Assessment   Upper Extremity Assessment: Defer to OT evaluation           Lower Extremity Assessment: LLE deficits/detail   LLE Deficits / Details: hip flexion and knee extension 3/5; ankle WFL     Communication   Communication: No difficulties  Cognition Arousal/Alertness: Awake/alert Behavior During Therapy: WFL for tasks assessed/performed Overall Cognitive Status: Within Functional Limits for tasks assessed                      General Comments      Exercises Total Joint Exercises Ankle Circles/Pumps: AROM;Both;10 reps Quad Sets: AROM;10 reps;Both;Strengthening Heel Slides: AROM;AAROM;Left;10 reps;Strengthening Hip ABduction/ADduction: AROM;AAROM;Strengthening;Left;10 reps      Assessment/Plan    PT Assessment Patient needs continued PT services  PT Diagnosis Difficulty walking   PT Problem List Decreased strength;Decreased range of motion;Decreased activity tolerance;Decreased mobility;Decreased knowledge of use of DME;Pain  PT Treatment Interventions DME instruction;Gait training;Stair training;Therapeutic exercise;Patient/family education   PT Goals (Current goals can be found in the Care Plan section) Acute Rehab PT Goals Patient Stated Goal: return to I PT Goal Formulation: With patient Time For Goal Achievement: 12/05/14 Potential to Achieve Goals: Good    Frequency 7X/week  Barriers to discharge        Co-evaluation               End of Session Equipment Utilized During Treatment: Gait belt Activity Tolerance: Patient tolerated treatment well Patient left: in chair;with call bell/phone within reach;with family/visitor present Nurse Communication: Mobility status         Time: 1006-1030 PT Time Calculation (min) (ACUTE ONLY): 24 min   Charges:   PT Evaluation $Initial PT Evaluation Tier I: 1 Procedure PT  Treatments $Gait Training: 8-22 mins $Therapeutic Exercise: 8-22 mins   PT G Codes:        Ronald Vinsant 2014-12-09, 10:41 AM

## 2014-12-04 NOTE — Progress Notes (Signed)
Utilization review completed.  

## 2014-12-04 NOTE — Progress Notes (Signed)
Physical Therapy Treatment Patient Details Name: [name] MRN: [id number] DOB: [birth year] Today's Date: 12/04/2014    History of Present Illness L DA THA; PMHx: R post THA    PT Comments    Pt progressing well  Follow Up Recommendations  Home health PT     Equipment Recommendations  None recommended by PT    Recommendations for Other Services       Precautions / Restrictions Precautions Precautions: None Restrictions Weight Bearing Restrictions: No Other Position/Activity Restrictions: WBAT    Mobility  Bed Mobility Overal bed mobility: Needs Assistance Bed Mobility: Supine to Sit;Sit to Supine     Supine to sit: Min assist Sit to supine: Min assist   General bed mobility comments: min with LLE  Transfers Overall transfer level: Needs assistance Equipment used: Rolling walker (2 wheeled) Transfers: Sit to/from Stand Sit to Stand: Supervision         General transfer comment: cues for safety and hand placement  Ambulation/Gait Ambulation/Gait assistance: Supervision Ambulation Distance (Feet): 80 Feet (x2) Assistive device: Rolling walker (2 wheeled) Gait Pattern/deviations: Step-through pattern;Step-to pattern;Decreased weight shift to left     General Gait Details: cues for sequence and to push RW   Stairs Stairs: Yes Stairs assistance: Supervision Stair Management: One rail Right;One rail Left;Forwards;With crutches;Step to pattern Number of Stairs: 6 (x3) General stair comments: cues for sequence, technique  Wheelchair Mobility    Modified Rankin (Stroke Patients Only)       Balance                                    Cognition Arousal/Alertness: Awake/alert Behavior During Therapy: WFL for tasks assessed/performed Overall Cognitive Status: Within Functional Limits for tasks assessed                      Exercises Total Joint Exercises Ankle Circles/Pumps: AROM;Both;10 reps Hip ABduction/ADduction:  AROM;AAROM;Strengthening;Left;10 reps;Standing Knee Flexion: AROM;Strengthening;Right;10 reps;Standing Marching in Standing: Strengthening;Right;10 reps;Standing    General Comments        Pertinent Vitals/Pain Pain Assessment: 0-10 Pain Score: 1  Pain Location: L hip Pain Descriptors / Indicators: Sore Pain Intervention(s): Limited activity within patient's tolerance;Monitored during session;Premedicated before session;Ice applied    Home Living Family/patient expects to be discharged to:: Private residence Living Arrangements: Spouse/significant other   Type of Home: House Home Access: Stairs to enter Entrance Stairs-Rails: Right Home Layout: Two level Home Equipment: Environmental consultant - 2 wheels;Crutches;Shower seat      Prior Function Level of Independence: Independent          PT Goals (current goals can now be found in the care plan section) Acute Rehab PT Goals Patient Stated Goal: return to I PT Goal Formulation: With patient Time For Goal Achievement: 12/05/14 Potential to Achieve Goals: Good Progress towards PT goals: Progressing toward goals    Frequency  7X/week    PT Plan Current plan remains appropriate    Co-evaluation             End of Session Equipment Utilized During Treatment: Gait belt Activity Tolerance: Patient tolerated treatment well Patient left: in bed;with call bell/phone within reach;with family/visitor present     Time: 0102-7253 PT Time Calculation (min) (ACUTE ONLY): 28 min  Charges:  $Gait Training: 23-37 mins                    G Codes:  [facility] 12/04/2014, 3:28 PM

## 2014-12-04 NOTE — Anesthesia Postprocedure Evaluation (Signed)
  Anesthesia Post-op Note  Patient: [name]  Procedure(s) Performed: Procedure(s) (LRB): LEFT TOTAL HIP ARTHROPLASTY ANTERIOR APPROACH (Left)  Patient Location: PACU  Anesthesia Type: Spinal  Level of Consciousness: awake and alert   Airway and Oxygen Therapy: Patient Spontanous Breathing  Post-op Pain: mild  Post-op Assessment: Post-op Vital signs reviewed, Patient's Cardiovascular Status Stable, Respiratory Function Stable, Patent Airway and No signs of Nausea or vomiting  Last Vitals:  Filed Vitals:   12/04/14 0544  BP: 108/58  Pulse: 62  Temp: 36.6 C  Resp: 16    Post-op Vital Signs: stable   Complications: No apparent anesthesia complications

## 2014-12-05 NOTE — Progress Notes (Signed)
Discharge summary sent to payer through [facility]  

## 2014-12-08 NOTE — Discharge Summary (Signed)
Physician Discharge Summary  Patient ID: [name] MRN: [id number] DOB/AGE: 1951-01-17 64 y.o.  Admit date: 12/03/2014 Discharge date: 12/04/2014   Procedures:  Procedure(s) (LRB): LEFT TOTAL HIP ARTHROPLASTY ANTERIOR APPROACH (Left)  Attending Physician:  Dr. Durene Romans   Admission Diagnoses:   Left hip primary OA / pain  Discharge Diagnoses:  Principal Problem:   S/P left THA, AA  Past Medical History  Diagnosis Date  . Hypertension   . Hyperlipidemia   . HSV-2 infection   . GERD (gastroesophageal reflux disease)   . History of colitis   . Gluten free diet   . PONV (postoperative nausea and vomiting)   . Arthritis     hands  . Dry eyes     HPI: [name], 64 y.o. female, has a history of pain and functional disability in the left hip(s) due to arthritis and patient has failed non-surgical conservative treatments for greater than 12 weeks to include NSAID's and/or analgesics, corticosteriod injections and activity modification. Onset of symptoms was gradual starting 2+ years ago with gradually worsening course since that time.The patient noted prior procedures of the hip to include arthroplasty, posterior approach on the right hip about 5 years ago. Patient currently rates pain in the left hip at 8 out of 10 with activity. Patient has night pain, worsening of pain with activity and weight bearing, trendelenberg gait, pain that interfers with activities of daily living and pain with passive range of motion. Patient has evidence of periarticular osteophytes and joint space narrowing by imaging studies. This condition presents safety issues increasing the risk of falls. There is no current active infection. Risks, benefits and expectations were discussed with the patient. Risks including but not limited to the risk of anesthesia, blood clots, nerve damage, blood vessel damage, failure of the prosthesis, infection and up to and including death. Patient understand  the risks, benefits and expectations and wishes to proceed with surgery.   PCP: Gweneth Dimitri, MD   Discharged Condition: good  Hospital Course:  Patient underwent the above stated procedure on 12/03/2014. Patient tolerated the procedure well and brought to the recovery room in good condition and subsequently to the floor.  POD #1 BP: 112/59 ; Pulse: 53 ; Temp: 97.8 F (36.6 C) ; Resp: 14 Patient reports pain as mild. Pain controlled, no events over night. Ready to be discharged home. Dorsiflexion/plantar flexion intact, incision: dressing C/D/I, no cellulitis present and compartment soft.   LABS  Basename    HGB  12.4  HCT  36.3    Discharge Exam: General appearance: alert, cooperative and no distress Extremities: Homans sign is negative, no sign of DVT, no edema, redness or tenderness in the calves or thighs and no ulcers, gangrene or trophic changes  Disposition: Home with follow up in 2 weeks   Follow-up Information    Follow up with Shelda Pal, MD. Schedule an appointment as soon as possible for a visit in 2 weeks.   Specialty:  Orthopedic Surgery   Contact information:   [address] [place] [address] [phone]       Follow up with [facility].   Why:  home health physical therapy    Contact information:   [address] [place] [address] [phone]       Follow up with [facility].   Why:  3n1   Contact information:   [address] [place] [address] [phone]  Discharge Instructions    Call MD / Call 911    Complete by:  As directed   If you experience chest pain or shortness of breath, CALL 911 and be transported to the hospital emergency room.  If you develope a fever above 101 F, pus (white drainage) or increased drainage or redness at the wound, or calf pain, call your surgeon's office.     Change dressing    Complete by:  As directed   Maintain surgical  dressing until follow up in the clinic. If the edges start to pull up, may reinforce with tape. If the dressing is no longer working, may remove and cover with gauze and tape, but must keep the area dry and clean.  Call with any questions or concerns.     Constipation Prevention    Complete by:  As directed   Drink plenty of fluids.  Prune juice may be helpful.  You may use a stool softener, such as Colace (over the counter) 100 mg twice a day.  Use MiraLax (over the counter) for constipation as needed.     Diet - low sodium heart healthy    Complete by:  As directed      Discharge instructions    Complete by:  As directed   Maintain surgical dressing until follow up in the clinic. If the edges start to pull up, may reinforce with tape. If the dressing is no longer working, may remove and cover with gauze and tape, but must keep the area dry and clean.  Follow up in 2 weeks at [facility]. Call with any questions or concerns.     Increase activity slowly as tolerated    Complete by:  As directed      TED hose    Complete by:  As directed   Use stockings (TED hose) for 2 weeks on both leg(s).  You may remove them at night for sleeping.     Weight bearing as tolerated    Complete by:  As directed   Laterality:  left  Extremity:  Lower             Medication List    STOP taking these medications        Hydrocodone-Acetaminophen 5-300 MG Tabs  Commonly known as:  VICODIN  Replaced by:  HYDROcodone-acetaminophen 7.5-325 MG per tablet      TAKE these medications        amLODipine 10 MG tablet  Commonly known as:  NORVASC  Take 10 mg by mouth every morning.     aspirin 325 MG EC tablet  Take 1 tablet (325 mg total) by mouth 2 (two) times daily.     atorvastatin 10 MG tablet  Commonly known as:  LIPITOR  Take 10 mg by mouth every morning.     CALCIUM 1200+D3 PO  Take 1 tablet by mouth daily.     cycloSPORINE 0.05 % ophthalmic emulsion  Commonly known as:   RESTASIS  Place 1 drop into both eyes 2 (two) times daily.     docusate sodium 100 MG capsule  Commonly known as:  COLACE  Take 1 capsule (100 mg total) by mouth 2 (two) times daily.     eszopiclone 1 MG Tabs tablet  Commonly known as:  LUNESTA  Take 1 mg by mouth at bedtime as needed. Take immediately before bedtime     ferrous sulfate 325 (65 FE) MG tablet  Take 1 tablet (325 mg total) by mouth  3 (three) times daily after meals.     Fish Oil 1000 MG Caps  Take 2 capsules by mouth daily.     hydrochlorothiazide 25 MG tablet  Commonly known as:  HYDRODIURIL  Take 25 mg by mouth every morning.     HYDROcodone-acetaminophen 7.5-325 MG per tablet  Commonly known as:  NORCO  Take 1-2 tablets by mouth every 4 (four) hours as needed for moderate pain.     methocarbamol 500 MG tablet  Commonly known as:  ROBAXIN  Take 1 tablet (500 mg total) by mouth every 6 (six) hours as needed for muscle spasms.     metoprolol succinate 50 MG 24 hr tablet  Commonly known as:  TOPROL-XL  Take 50 mg by mouth every morning. Take with or immediately following a meal.     multivitamin tablet  Take 1 tablet by mouth daily.     pantoprazole 40 MG tablet  Commonly known as:  PROTONIX  Take 40 mg by mouth every morning.     polyethylene glycol packet  Commonly known as:  MIRALAX / GLYCOLAX  Take 17 g by mouth 2 (two) times daily.     UCERIS 9 MG Tb24  Generic drug:  Budesonide  Take by mouth.     valACYclovir 1000 MG tablet  Commonly known as:  VALTREX  Take 500 mg by mouth daily.         Signed: Anastasio Auerbach. Sage Kopera   PA-C  12/08/2014, 8:38 AM

## 2015-07-10 IMAGING — CR DG CHEST 2V
2 series · 2 of 2 positions shown · non-contrast
Comparison: None.

CLINICAL DATA: Hypertension.

EXAM:
CHEST  2 VIEW

[w chest pa]
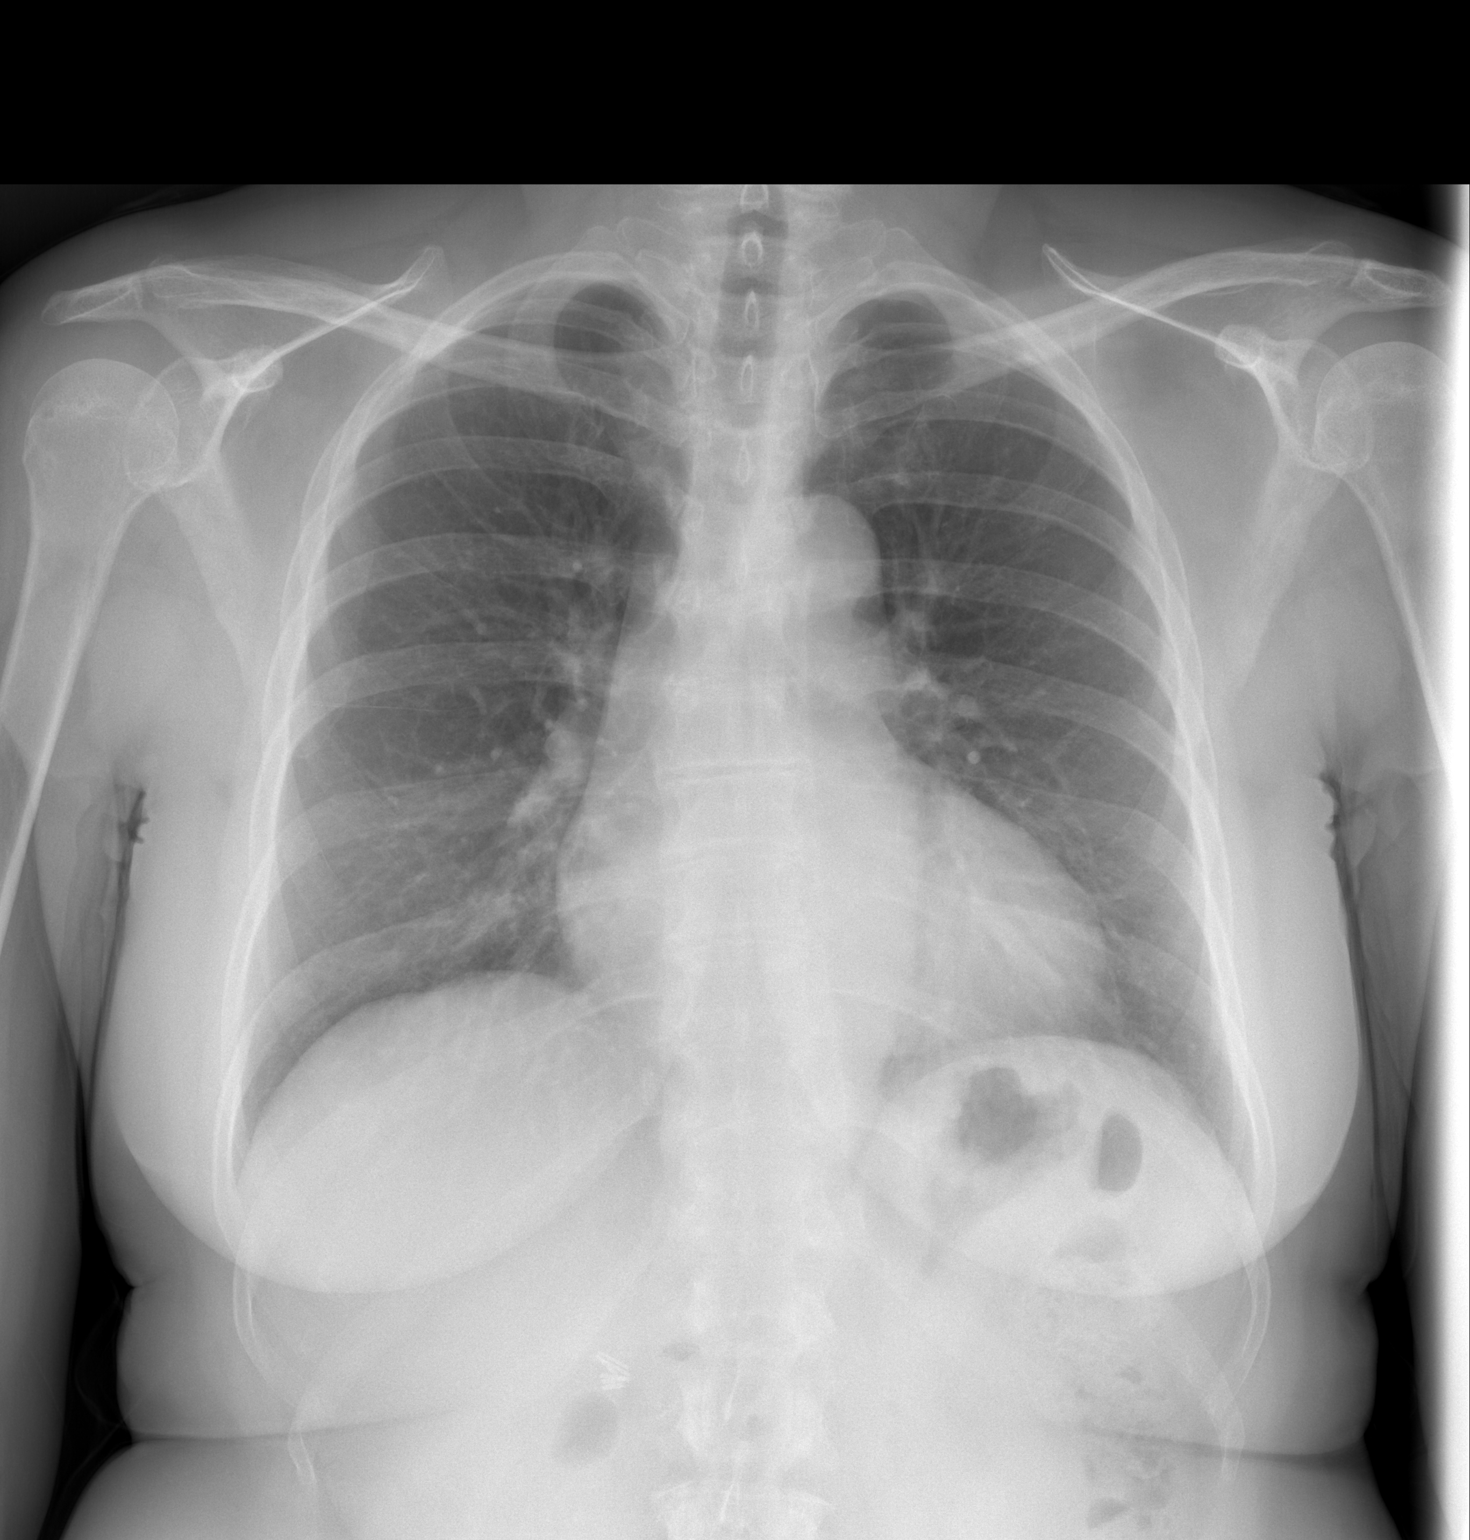

[w chest lat]
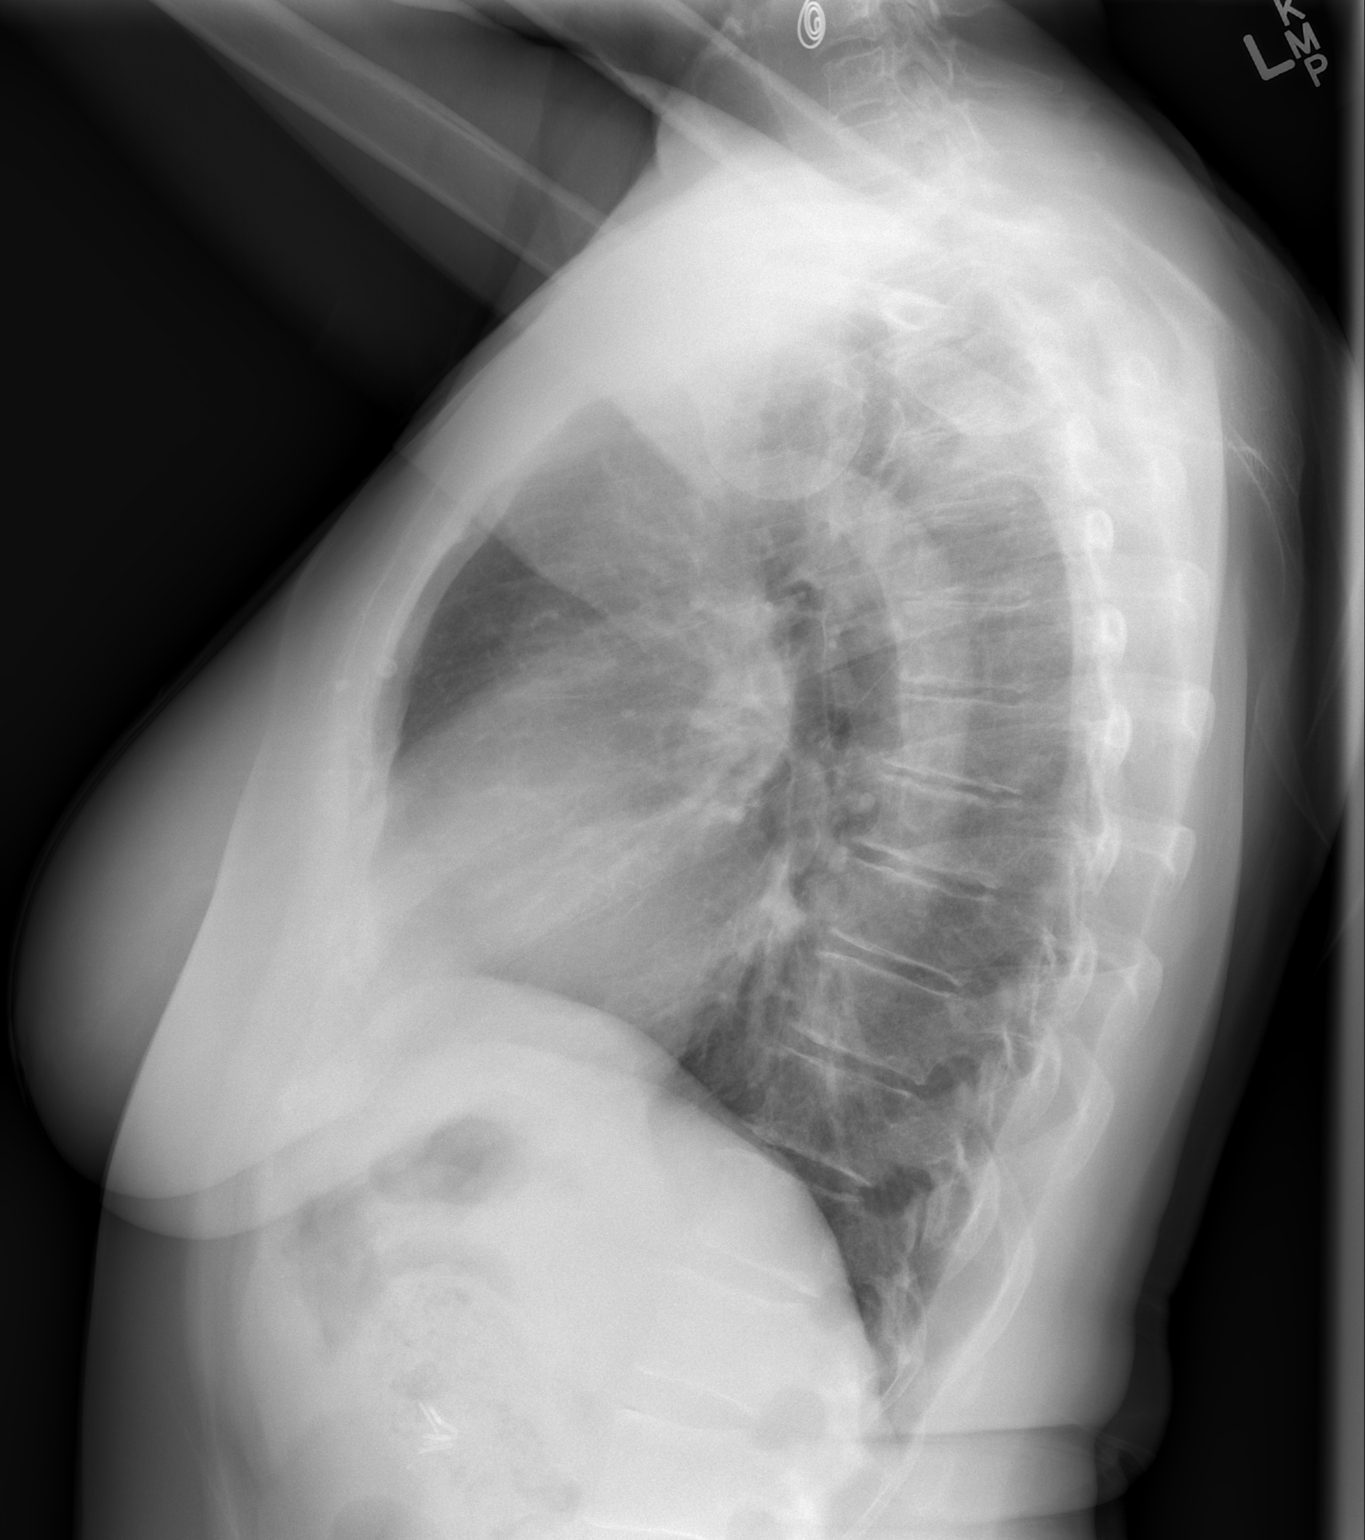

[2 of 2 positions shown; findings below may reference images not displayed]

FINDINGS: The heart size and mediastinal contours are within normal limits.
Both lungs are clear. The visualized skeletal structures are
unremarkable.
IMPRESSION: No active cardiopulmonary disease.

## 2015-07-17 IMAGING — DX DG PORTABLE PELVIS
1 series · 1 of 1 positions shown · non-contrast
Comparison: CT of the abdomen and pelvis 08/06/2011

CLINICAL DATA: Status post left hip replacement.

EXAM:
PORTABLE PELVIS 1-2 VIEWS

[pelvis ap]
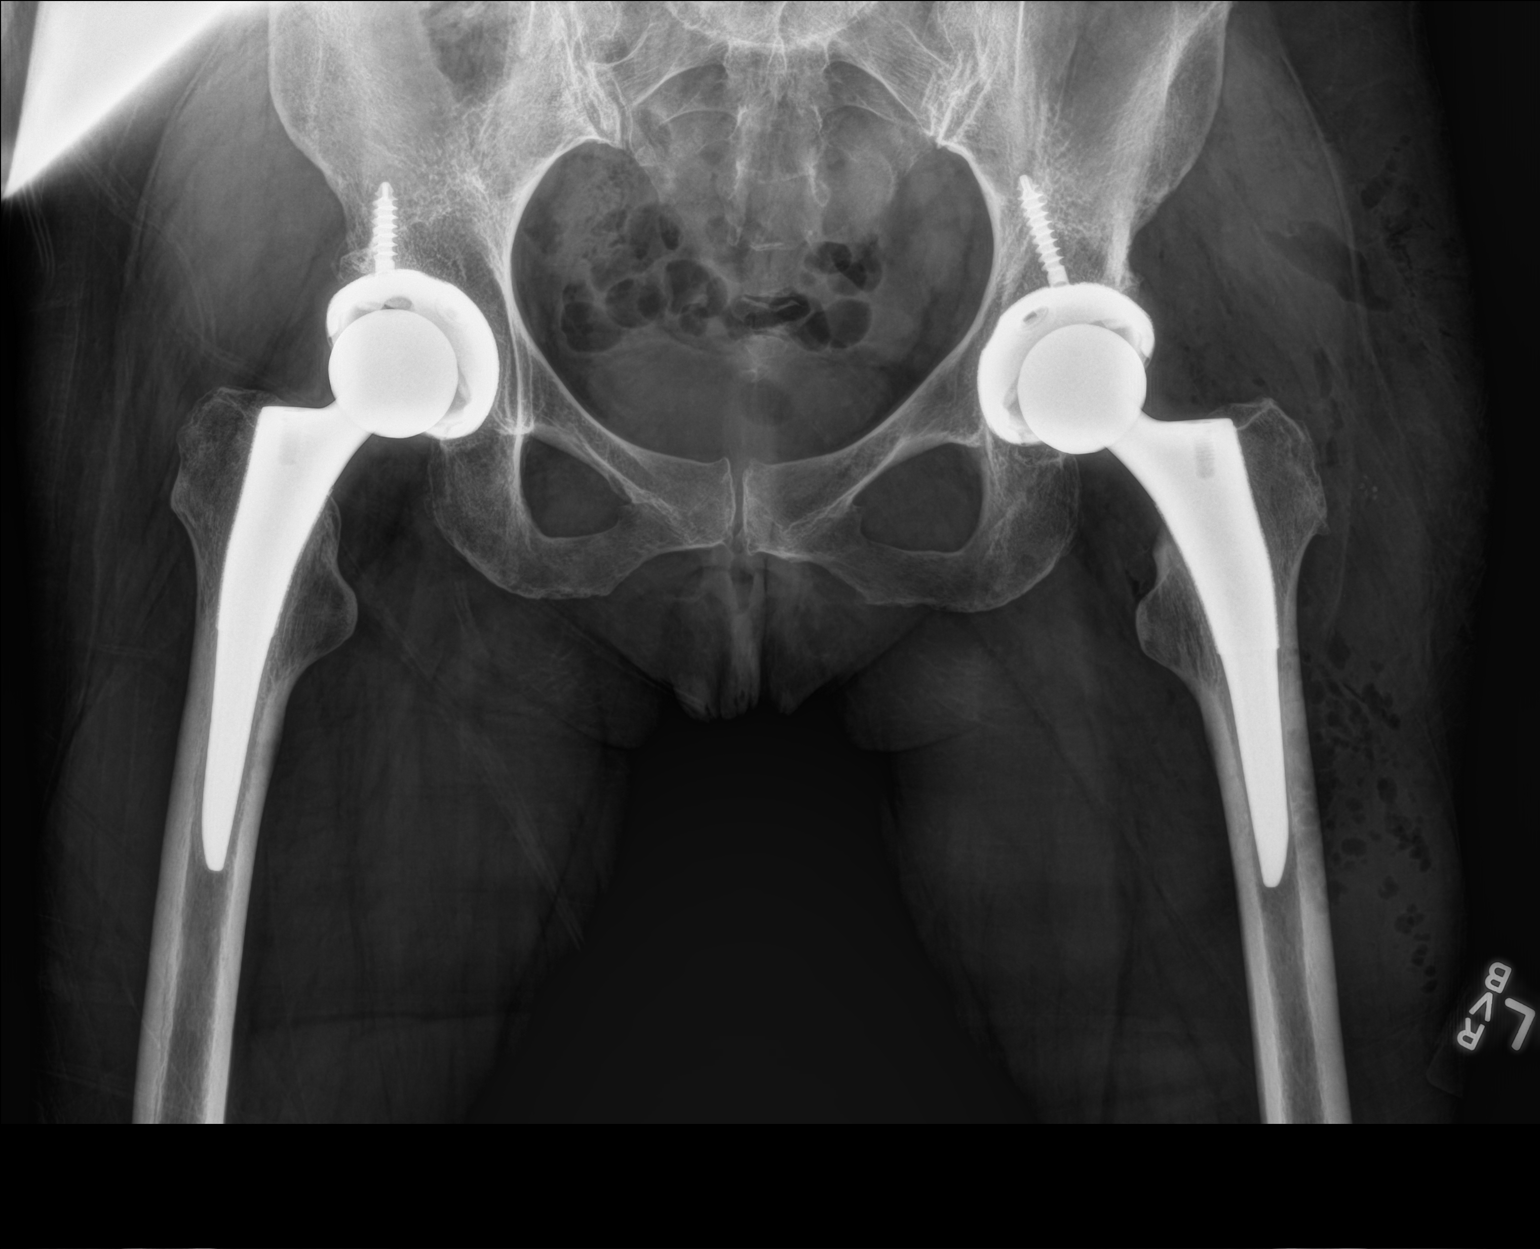

[1 of 1 positions shown; findings below may reference images not displayed]

FINDINGS: Patient has undergone left hip arthroplasty. Postoperative gas is
identified within the soft tissues of the left hip. No new fracture
identified. Appearance of right hip arthroplasty is stable. No
evidence for dislocation on this frontal view.
IMPRESSION: Status post bilateral hip arthroplasty, most recently on the left.
No adverse features identified.

## 2016-09-17 ENCOUNTER — Other Ambulatory Visit: Payer: Self-pay | Admitting: Family Medicine

## 2016-09-18 LAB — CMP12+LP+TP+TSH+6AC+CBC/D/PLT
ALBUMIN: 5.1 g/dL — AB (ref 3.6–4.8)
ALK PHOS: 92 IU/L (ref 39–117)
ALT: 28 IU/L (ref 0–32)
AST: 30 IU/L (ref 0–40)
Albumin/Globulin Ratio: 2.7 — ABNORMAL HIGH (ref 1.2–2.2)
BASOS: 1 %
BILIRUBIN TOTAL: 0.6 mg/dL (ref 0.0–1.2)
BUN/Creatinine Ratio: 16 (ref 12–28)
BUN: 12 mg/dL (ref 8–27)
Basophils Absolute: 0.1 10*3/uL (ref 0.0–0.2)
CHLORIDE: 93 mmol/L — AB (ref 96–106)
Calcium: 9.5 mg/dL (ref 8.7–10.3)
Chol/HDL Ratio: 2.2 ratio units (ref 0.0–4.4)
Cholesterol, Total: 159 mg/dL (ref 100–199)
Creatinine, Ser: 0.74 mg/dL (ref 0.57–1.00)
EOS (ABSOLUTE): 0.2 10*3/uL (ref 0.0–0.4)
Eos: 5 %
FREE THYROXINE INDEX: 1.7 (ref 1.2–4.9)
GFR calc Af Amer: 100 mL/min/{1.73_m2} (ref >59)
GFR calc non Af Amer: 86 mL/min/{1.73_m2} (ref >59)
GGT: 64 IU/L — ABNORMAL HIGH (ref 0–60)
Globulin, Total: 1.9 g/dL (ref 1.5–4.5)
Glucose: 88 mg/dL (ref 65–99)
HDL: 71 mg/dL (ref >39)
HEMATOCRIT: 41 % (ref 34.0–46.6)
HEMOGLOBIN: 13.9 g/dL (ref 11.1–15.9)
IMMATURE GRANS (ABS): 0 10*3/uL (ref 0.0–0.1)
Immature Granulocytes: 0 %
Iron: 104 ug/dL (ref 27–139)
LDH: 206 IU/L (ref 119–226)
LDL CALC: 68 mg/dL (ref 0–99)
Lymphocytes Absolute: 1.6 10*3/uL (ref 0.7–3.1)
Lymphs: 31 %
MCH: 30 pg (ref 26.6–33.0)
MCHC: 33.9 g/dL (ref 31.5–35.7)
MCV: 88 fL (ref 79–97)
MONOCYTES: 9 %
Monocytes Absolute: 0.4 10*3/uL (ref 0.1–0.9)
Neutrophils Absolute: 2.7 10*3/uL (ref 1.4–7.0)
Neutrophils: 54 %
Phosphorus: 4 mg/dL (ref 2.5–4.5)
Platelets: 256 10*3/uL (ref 150–379)
Potassium: 3.9 mmol/L (ref 3.5–5.2)
RBC: 4.64 x10E6/uL (ref 3.77–5.28)
RDW: 14.5 % (ref 12.3–15.4)
SODIUM: 136 mmol/L (ref 134–144)
T3 Uptake Ratio: 28 % (ref 24–39)
T4, Total: 5.9 ug/dL (ref 4.5–12.0)
TSH: 2.27 u[IU]/mL (ref 0.450–4.500)
Total Protein: 7 g/dL (ref 6.0–8.5)
Triglycerides: 100 mg/dL (ref 0–149)
Uric Acid: 4.8 mg/dL (ref 2.5–7.1)
VLDL CHOLESTEROL CAL: 20 mg/dL (ref 5–40)
WBC: 5 10*3/uL (ref 3.4–10.8)

## 2016-09-18 LAB — HGB A1C W/O EAG: Hgb A1c MFr Bld: 5 % (ref 4.8–5.6)

## 2017-06-16 ENCOUNTER — Other Ambulatory Visit: Payer: Self-pay | Admitting: Physician Assistant

## 2017-06-16 DIAGNOSIS — R1032 Left lower quadrant pain: Secondary | ICD-10-CM

## 2017-06-22 ENCOUNTER — Other Ambulatory Visit: Payer: PRIVATE HEALTH INSURANCE

## 2017-07-29 ENCOUNTER — Ambulatory Visit: Payer: Self-pay | Admitting: *Deleted

## 2017-07-29 DIAGNOSIS — S61411A Laceration without foreign body of right hand, initial encounter: Secondary | ICD-10-CM

## 2017-07-29 NOTE — Progress Notes (Signed)
Pt presents with hand pain and swelling. Reports she was pulling a wheeled ladder and caught her R hand between the ladder and a column just prior to arrival. 2 small skin tears noted to R central posterior hand. Mild swelling and bruising starting. No obvious deformity. CMS intact. No immediate concern for fracture. Site cleaned with first aid antiseptic, triple abx cream applied, covered with telfa and loosely wrapped in coban. Ice pack with skin protectant sleeve provided to pt with instructions. 20 min on/20 min off. Cleaning for next few days with soap/water, triple abx cream discussed. Pt verbalizes understanding and agreement. No further questions/concerns.

## 2017-08-10 ENCOUNTER — Ambulatory Visit: Payer: Self-pay | Admitting: Registered Nurse

## 2017-08-10 VITALS — BP 126/74 | HR 62 | Temp 98.2°F

## 2017-08-10 DIAGNOSIS — S60222A Contusion of left hand, initial encounter: Secondary | ICD-10-CM

## 2017-08-10 NOTE — Patient Instructions (Addendum)
Contusion A contusion is a deep bruise. Contusions are the result of a blunt injury to tissues and muscle fibers under the skin. The injury causes bleeding under the skin. The skin overlying the contusion may turn blue, purple, or yellow. Minor injuries will give you a painless contusion, but more severe contusions may stay painful and swollen for a few weeks. What are the causes? This condition is usually caused by a blow, trauma, or direct force to an area of the body. What are the signs or symptoms? Symptoms of this condition include:  Swelling of the injured area.  Pain and tenderness in the injured area.  Discoloration. The area may have redness and then turn blue, purple, or yellow.  How is this diagnosed? This condition is diagnosed based on a physical exam and medical history. An X-ray, CT scan, or MRI may be needed to determine if there are any associated injuries, such as broken bones (fractures). How is this treated? Specific treatment for this condition depends on what area of the body was injured. In general, the best treatment for a contusion is resting, icing, applying pressure to (compression), and elevating the injured area. This is often called the RICE strategy. Over-the-counter anti-inflammatory medicines may also be recommended for pain control. Follow these instructions at home:  Rest the injured area.  If directed, apply ice to the injured area: ? Put ice in a plastic bag. ? Place a towel between your skin and the bag. ? Leave the ice on for 20 minutes, 2-3 times per day.  If directed, apply light compression to the injured area using an elastic bandage. Make sure the bandage is not wrapped too tightly. Remove and reapply the bandage as directed by your health care provider.  If possible, raise (elevate) the injured area above the level of your heart while you are sitting or lying down.  Take over-the-counter and prescription medicines only as told by your health  care provider. Contact a health care provider if:  Your symptoms do not improve after several days of treatment.  Your symptoms get worse.  You have difficulty moving the injured area. Get help right away if:  You have severe pain.  You have numbness in a hand or foot.  Your hand or foot turns pale or cold. This information is not intended to replace advice given to you by your health care provider. Make sure you discuss any questions you have with your health care provider. Document Released: 09/02/2005 Document Revised: 04/02/2016 Document Reviewed: 04/10/2015 Elsevier Interactive Patient Education  2017 [employer] Edema Reduction (Contrast Baths)    Have 2 containers deep enough for involved area to be immersed. Fill one with warm water and the other with slightly chilled water. Soak in warm water for 1 to 2 minutes; cold for  to 1 minute. Alternate and continue for 10 minutes. End in cold water.  Copyright  VHI. All rights reserved.    RICE for Routine Care of Injuries Many injuries can be cared for using rest, ice, compression, and elevation (RICE therapy). Using RICE therapy can help to lessen pain and swelling. It can help your body to heal. Rest Reduce your normal activities and avoid using the injured part of your body. You can go back to your normal activities when you feel okay and your doctor says it is okay. Ice Do not put ice on your bare skin.  Put ice in a plastic bag.  Place a towel between your skin and the  bag.  Leave the ice on for 20 minutes, 2-3 times a day.  Do this for as long as told by your doctor. Compression Compression means putting pressure on the injured area. This can be done with an elastic bandage. If an elastic bandage has been applied:  Remove and reapply the bandage every 3-4 hours or as told by your doctor.  Make sure the bandage is not wrapped too tight. Wrap the bandage more loosely if part of your body beyond the bandage  is blue, swollen, cold, painful, or loses feeling (numb).  See your doctor if the bandage seems to make your problems worse.  Elevation Elevation means keeping the injured area raised. Raise the injured area above your heart or the center of your chest if you can. When should I get help? You should get help if:  You keep having pain and swelling.  Your symptoms get worse.  Get help right away if: You should get help right away if:  You have sudden bad pain at or below the area of your injury.  You have redness or more swelling around your injury.  You have tingling or numbness at or below the injury that does not go away when you take off the bandage.  This information is not intended to replace advice given to you by your health care provider. Make sure you discuss any questions you have with your health care provider. Document Released: 05/11/2008 Document Revised: 10/20/2016 Document Reviewed: 10/31/2014 Elsevier Interactive Patient Education  2017 [employer]

## 2017-08-10 NOTE — Progress Notes (Signed)
Subjective:    Patient ID: [name], female    DOB: 1951/03/02, 66 y.o.   MRN: [id number]  66y/o caucasian female established Pt was seen by RN Zella Ball on 8/23 after catching L hand between ladder and a column causing 2 small skin tears to posterior L hand. Wound was cleaned and dressed with triple abx cream, telfa and coban. Now with continued pain and some swelling. Reports she was working in the yard and hand became more swollen and painful over the weekend. Majority of swelling has resolved now. Took advil yesterday to help with pain and swelling.  Hasn't tried compression or ice.  Right hand dominant      Review of Systems  Constitutional: Negative for activity change, appetite change, chills, diaphoresis, fatigue and fever.  HENT: Negative for trouble swallowing and voice change.   Eyes: Negative for photophobia and visual disturbance.  Respiratory: Negative for wheezing and stridor.   Cardiovascular: Negative for chest pain and leg swelling.  Gastrointestinal: Negative for blood in stool and vomiting.  Endocrine: Negative for cold intolerance and heat intolerance.  Genitourinary: Negative for difficulty urinating and hematuria.  Musculoskeletal: Positive for joint swelling and myalgias. Negative for arthralgias, back pain, gait problem, neck pain and neck stiffness.  Skin: Positive for color change and wound. Negative for pallor and rash.  Allergic/Immunologic: Negative for environmental allergies and food allergies.  Neurological: Negative for dizziness, tremors, seizures, syncope, facial asymmetry, speech difficulty, weakness, light-headedness, numbness and headaches.  Hematological: Negative for adenopathy. Does not bruise/bleed easily.  Psychiatric/Behavioral: Negative for agitation, confusion, self-injury and sleep disturbance. The patient is not nervous/anxious.        Objective:   Physical Exam  Constitutional: She is oriented to person, place, and time. Vital  signs are normal. She appears well-developed and well-nourished. She is active and cooperative.  Non-toxic appearance. She does not have a sickly appearance. She does not appear ill. No distress.  HENT:  Head: Normocephalic and atraumatic.  Right Ear: Hearing and external ear normal.  Left Ear: Hearing and external ear normal.  Nose: Nose normal.  Mouth/Throat: Uvula is midline, oropharynx is clear and moist and mucous membranes are normal. No oropharyngeal exudate.  Eyes: Pupils are equal, round, and reactive to light. Conjunctivae, EOM and lids are normal. Right eye exhibits no discharge. Left eye exhibits no discharge. No scleral icterus.  Neck: Trachea normal, normal range of motion and phonation normal. Neck supple. No neck rigidity. No tracheal deviation, no edema, no erythema and normal range of motion present.  Cardiovascular: Normal rate, regular rhythm, normal heart sounds and intact distal pulses.   Pulses:      Radial pulses are 2+ on the right side, and 2+ on the left side.  Pulmonary/Chest: Effort normal and breath sounds normal. No accessory muscle usage or stridor. No respiratory distress. She has no decreased breath sounds. She has no wheezes. She has no rhonchi. She has no rales.  Abdominal: Soft. Normal appearance. She exhibits no distension. There is no rigidity and no guarding.  Musculoskeletal: Normal range of motion. She exhibits edema and tenderness. She exhibits no deformity.       Right shoulder: Normal.       Left shoulder: Normal.       Right elbow: Normal.      Left elbow: Normal.       Right wrist: Normal.       Left wrist: Normal.       Right hip: Normal.  Left hip: Normal.       Right knee: Normal.       Left knee: Normal.       Cervical back: Normal.       Thoracic back: Normal.       Lumbar back: Normal.       Right upper arm: Normal.       Left upper arm: Normal.       Right forearm: Normal.       Left forearm: Normal.       Right hand: Normal.        Left hand: She exhibits tenderness and swelling. She exhibits normal range of motion, no bony tenderness, normal two-point discrimination, normal capillary refill and no laceration. Normal sensation noted. Decreased sensation is not present in the ulnar distribution, is not present in the medial redistribution and is not present in the radial distribution. Normal strength noted. She exhibits no finger abduction, no thumb/finger opposition and no wrist extension trouble.       Hands: Lymphadenopathy:    She has no cervical adenopathy.  Neurological: She is alert and oriented to person, place, and time. She has normal strength. She is not disoriented. She displays no atrophy and no tremor. No cranial nerve deficit or sensory deficit. She exhibits normal muscle tone. She displays no seizure activity. Coordination and gait normal. GCS eye subscore is 4. GCS verbal subscore is 5. GCS motor subscore is 6.  Bilateral hand grasp equal; pain with resistance left 3rd finger flexion to extension; on/off exam table; in/out of chair without difficulty gait sure and steady in hall  Skin: Skin is warm, dry and intact. Bruising noted. No abrasion, no burn, no ecchymosis, no laceration, no lesion, no petechiae and no rash noted. She is not diaphoretic. No cyanosis or erythema. No pallor. Nails show no clubbing.     Psychiatric: She has a normal mood and affect. Her speech is normal and behavior is normal. Judgment and thought content normal. Cognition and memory are normal.  Nursing note and vitals reviewed.         Assessment & Plan:  A-contusion left hand initial  P-contrast baths, compression, prn tylenol 1000mg  po daily and/or motrin 800mg  po daily x 1 week prn pain/swelling OTC  RICE, given 1 roll cobain to wrap left hand; consider xray in 1 week if no improvement or worsening of swelling/pain with AROM patient prefers [place]; has ice pack at home; exitcare handout on contusion, rice and contrast  baths given to patient  Patient was instructed to rest, ice and elevate left hand.  Do not soak hand until lacerations healed avoid pool, lake, hot tub, dirty sink water.  Exitcare handout on contusion, laceration, hand pain given to patient.   Medications as directed.  Call or return to clinic as needed if these symptoms worsen or fail to improve as anticipated and will consider xray if worsening/no improvement x 7 days with plan of care.  Patient verbalized agreement and understanding of treatment plan and had no further questions at this time.  P2:  ROM, injury prevention

## 2017-08-11 MED ORDER — ACETAMINOPHEN 500 MG PO TABS: 1000.0000 mg | ORAL_TABLET | Freq: Four times a day (QID) | ORAL | 0 refills | Status: AC | PRN

## 2017-08-17 ENCOUNTER — Ambulatory Visit: Payer: Self-pay | Admitting: Registered Nurse

## 2017-08-17 VITALS — BP 130/75 | HR 48 | Temp 97.8°F

## 2017-08-17 DIAGNOSIS — S60222D Contusion of left hand, subsequent encounter: Secondary | ICD-10-CM

## 2017-08-17 NOTE — Progress Notes (Signed)
Subjective:    Patient ID: [name], female    DOB: Jul 05, 1951, 66 y.o.   MRN: [id number]  66y/o caucasian established female Pt reports continued R posterior hand pain. Much improved, but still present. Does not wake with pain, but it increases throughout the day as she uses her hand. Soreness to site. Full ROM, full grip strength intact.   Has been icing and taking pain medication prn po.  Used cobain for a couple days but hasn't needed it recently for wrapping left hand she felt ice and cobain helped the most.  Initally Pt was seen by Kennyth LoseN Workman on 8/23 after catching L hand between ladder and a column causing 2 small skin tears to posterior L hand. Wound was cleaned and dressed with triple abx cream, telfa and coban. Now with continued pain and some swelling. Reports she was working in the yard and hand became more swollen and painful over the weekend and took advil po prn over the holiday weekend was seen 09/04 by myself. Majority of swelling has resolved  Right hand dominant      Review of Systems Review of Systems  Constitutional: Negative for activity change, appetite change, chills, diaphoresis, fatigue and fever.  HENT: Negative for trouble swallowing and voice change.   Eyes: Negative for photophobia and visual disturbance.  Respiratory: Negative for wheezing and stridor.   Cardiovascular: Negative for chest pain and leg swelling.  Gastrointestinal: Negative for blood in stool and vomiting.  Endocrine: Negative for cold intolerance and heat intolerance.  Genitourinary: Negative for difficulty urinating and hematuria.  Musculoskeletal: Positive for joint swelling and myalgias. Negative for arthralgias, back pain, gait problem, neck pain and neck stiffness.  Skin: Positive for color change and wound. Negative for pallor and rash.  Allergic/Immunologic: Negative for environmental allergies and food allergies.  Neurological: Negative for dizziness, tremors, seizures, syncope,  facial asymmetry, speech difficulty, weakness, light-headedness, numbness and headaches.  Hematological: Negative for adenopathy. Does not bruise/bleed easily.  Psychiatric/Behavioral: Negative for agitation, confusion, self-injury and sleep disturbance. The patient is not nervous/anxious.      Objective:   Physical Exam  Constitutional: She is oriented to person, place, and time. Vital signs are normal. She appears well-developed and well-nourished. No distress.  HENT:  Head: Normocephalic and atraumatic.  Right Ear: External ear normal.  Left Ear: External ear normal.  Nose: Nose normal.  Mouth/Throat: Oropharynx is clear and moist. No oropharyngeal exudate.  Eyes: Pupils are equal, round, and reactive to light. Conjunctivae, EOM and lids are normal. Right eye exhibits no discharge. Left eye exhibits no discharge. No scleral icterus.  Neck: Trachea normal and normal range of motion. Neck supple. No tracheal deviation present.  Cardiovascular: Normal rate, regular rhythm, normal heart sounds and intact distal pulses.   Pulmonary/Chest: Effort normal and breath sounds normal. No stridor. No respiratory distress. She has no wheezes. She has no rales.  Musculoskeletal: Normal range of motion. She exhibits tenderness. She exhibits no edema or deformity.       Right shoulder: Normal.       Left shoulder: Normal.       Right elbow: Normal.      Left elbow: Normal.       Right wrist: Normal.       Left wrist: Normal.       Right hand: Normal.       Left hand: She exhibits tenderness and swelling. She exhibits normal range of motion, no bony tenderness, normal two-point discrimination,  normal capillary refill, no deformity and no laceration. Normal sensation noted. Decreased sensation is not present in the ulnar distribution, is not present in the medial redistribution and is not present in the radial distribution. Normal strength noted. She exhibits no finger abduction, no thumb/finger opposition  and no wrist extension trouble.       Hands: Neurological: She is alert and oriented to person, place, and time. No cranial nerve deficit. She exhibits abnormal muscle tone. Coordination normal.  Skin: Skin is warm, dry and intact. No rash noted. She is not diaphoretic. No erythema. No pallor.  Psychiatric: She has a normal mood and affect. Her speech is normal and behavior is normal. Judgment and thought content normal. Cognition and memory are normal.  Nursing note and vitals reviewed.         Assessment & Plan:   A-contusion left hand initial  P-Healing improved swelling 75% and 90% pain from last appt.  Continue contrast baths, compression, prn tylenol  po daily and/or motrin  po daily x 1 week prn pain/swelling OTC  RICE, Still has partial roll 2 cobain to wrap left hand at home.  Pain has been improving do not feel xray beneficial at this time; has ice pack at home; exitcare handout on contusion, rice and contrast baths given to patient  Patient was instructed to rest, ice and elevate left hand.  Do not soak hand until lacerations healed avoid pool, lake, hot tub, dirty sink water.  Exitcare handout on contusion, laceration, hand pain given to patient.   Medications as directed.  Call or return to clinic as needed if these symptoms worsen or fail to improve as anticipated Patient verbalized agreement and understanding of treatment plan and had no further questions at this time.  P2:  ROM, injury prevention

## 2017-08-18 NOTE — Patient Instructions (Signed)
Elastic Bandage and RICE What does an elastic bandage do? Elastic bandages come in different shapes and sizes. They generally provide support to your injury and reduce swelling while you are healing, but they can perform different functions. Your health care provider will help you to decide what is best for your protection, recovery, or rehabilitation following an injury. What are some general tips for using an elastic bandage?  Use the bandage as directed by the maker of the bandage that you are using.  Do not wrap the bandage too tightly. This may cut off the circulation in the arm or leg in the area below the bandage. ? If part of your body beyond the bandage becomes blue, numb, cold, swollen, or is more painful, your bandage is most likely too tight. If this occurs, remove your bandage and reapply it more loosely.  See your health care provider if the bandage seems to be making your problems worse rather than better.  An elastic bandage should be removed and reapplied every 3-4 hours or as directed by your health care provider. What is RICE? The routine care of many injuries includes rest, ice, compression, and elevation (RICE therapy). Rest Rest is required to allow your body to heal. Generally, you can resume your routine activities when you are comfortable and have been given permission by your health care provider. Ice Icing your injury helps to keep the swelling down and it reduces pain. Do not apply ice directly to your skin.  Put ice in a plastic bag.  Place a towel between your skin and the bag.  Leave the ice on for 20 minutes, 2-3 times per day.  Do this for as long as you are directed by your health care provider. Compression Compression helps to keep swelling down, gives support, and helps with discomfort. Compression may be done with an elastic bandage. Elevation Elevation helps to reduce swelling and it decreases pain. If possible, your injured area should be placed at  or above the level of your heart or the center of your chest. When should I seek medical care? You should seek medical care if:  You have persistent pain and swelling.  Your symptoms are getting worse rather than improving.  These symptoms may indicate that further evaluation or further X-rays are needed. Sometimes, X-rays may not show a small broken bone (fracture) until a number of days later. Make a follow-up appointment with your health care provider. Ask when your X-ray results will be ready. Make sure that you get your X-ray results. When should I seek immediate medical care? You should seek immediate medical care if:  You have a sudden onset of severe pain at or below the area of your injury.  You develop redness or increased swelling around your injury.  You have tingling or numbness at or below the area of your injury that does not improve after you remove the elastic bandage.  This information is not intended to replace advice given to you by your health care provider. Make sure you discuss any questions you have with your health care provider. Document Released: 05/15/2002 Document Revised: 10/19/2016 Document Reviewed: 07/09/2014 Elsevier Interactive Patient Education  2017 [employer] Contusion A contusion is a deep bruise. Contusions are the result of a blunt injury to tissues and muscle fibers under the skin. The injury causes bleeding under the skin. The skin overlying the contusion may turn blue, purple, or yellow. Minor injuries will give you a painless contusion, but more severe contusions  may stay painful and swollen for a few weeks. What are the causes? This condition is usually caused by a blow, trauma, or direct force to an area of the body. What are the signs or symptoms? Symptoms of this condition include:  Swelling of the injured area.  Pain and tenderness in the injured area.  Discoloration. The area may have redness and then turn blue, purple, or  yellow.  How is this diagnosed? This condition is diagnosed based on a physical exam and medical history. An X-ray, CT scan, or MRI may be needed to determine if there are any associated injuries, such as broken bones (fractures). How is this treated? Specific treatment for this condition depends on what area of the body was injured. In general, the best treatment for a contusion is resting, icing, applying pressure to (compression), and elevating the injured area. This is often called the RICE strategy. Over-the-counter anti-inflammatory medicines may also be recommended for pain control. Follow these instructions at home:  Rest the injured area.  If directed, apply ice to the injured area: ? Put ice in a plastic bag. ? Place a towel between your skin and the bag. ? Leave the ice on for 20 minutes, 2-3 times per day.  If directed, apply light compression to the injured area using an elastic bandage. Make sure the bandage is not wrapped too tightly. Remove and reapply the bandage as directed by your health care provider.  If possible, raise (elevate) the injured area above the level of your heart while you are sitting or lying down.  Take over-the-counter and prescription medicines only as told by your health care provider. Contact a health care provider if:  Your symptoms do not improve after several days of treatment.  Your symptoms get worse.  You have difficulty moving the injured area. Get help right away if:  You have severe pain.  You have numbness in a hand or foot.  Your hand or foot turns pale or cold. This information is not intended to replace advice given to you by your health care provider. Make sure you discuss any questions you have with your health care provider. Document Released: 09/02/2005 Document Revised: 04/02/2016 Document Reviewed: 04/10/2015 Elsevier Interactive Patient Education  2017 [employer]

## 2017-08-20 ENCOUNTER — Ambulatory Visit: Payer: Self-pay | Admitting: *Deleted

## 2017-08-20 VITALS — BP 135/78 | HR 57 | Ht 60.5 in | Wt 132.0 lb

## 2017-08-20 DIAGNOSIS — Z0000 Encounter for general adult medical examination without abnormal findings: Secondary | ICD-10-CM

## 2017-08-20 NOTE — Progress Notes (Signed)
[employer] insurance premium discount evaluation: Labs Drawn. Replacements ROI form signed. Tobacco Free Attestation form signed.  Forms placed in paper chart. Okay to route results to pcp per pt. 

## 2017-08-23 LAB — CMP12+LP+TP+TSH+6AC+CBC/D/PLT
ALBUMIN: 5.3 g/dL — AB (ref 3.6–4.8)
ALT: 31 IU/L (ref 0–32)
AST: 32 IU/L (ref 0–40)
Albumin/Globulin Ratio: 2.5 — ABNORMAL HIGH (ref 1.2–2.2)
Alkaline Phosphatase: 90 IU/L (ref 39–117)
BUN/Creatinine Ratio: 16 (ref 12–28)
BUN: 14 mg/dL (ref 8–27)
Basophils Absolute: 0.1 10*3/uL (ref 0.0–0.2)
Basos: 1 %
Bilirubin Total: 0.5 mg/dL (ref 0.0–1.2)
CHOL/HDL RATIO: 2.1 ratio (ref 0.0–4.4)
Calcium: 9.8 mg/dL (ref 8.7–10.3)
Chloride: 96 mmol/L (ref 96–106)
Cholesterol, Total: 178 mg/dL (ref 100–199)
Creatinine, Ser: 0.85 mg/dL (ref 0.57–1.00)
EOS (ABSOLUTE): 0.1 10*3/uL (ref 0.0–0.4)
Eos: 2 %
Estimated CHD Risk: <0.5 times avg. (ref 0.0–1.0)
Free Thyroxine Index: 1.9 (ref 1.2–4.9)
GFR calc Af Amer: 84 mL/min/{1.73_m2} (ref >59)
GFR calc non Af Amer: 73 mL/min/{1.73_m2} (ref >59)
GGT: 63 IU/L — ABNORMAL HIGH (ref 0–60)
GLOBULIN, TOTAL: 2.1 g/dL (ref 1.5–4.5)
Glucose: 97 mg/dL (ref 65–99)
HDL: 83 mg/dL (ref >39)
Hematocrit: 39.9 % (ref 34.0–46.6)
Hemoglobin: 13.1 g/dL (ref 11.1–15.9)
IMMATURE GRANS (ABS): 0 10*3/uL (ref 0.0–0.1)
Immature Granulocytes: 0 %
Iron: 46 ug/dL (ref 27–139)
LDH: 225 IU/L (ref 119–226)
LDL Calculated: 82 mg/dL (ref 0–99)
LYMPHS ABS: 1.7 10*3/uL (ref 0.7–3.1)
Lymphs: 32 %
MCH: 29.9 pg (ref 26.6–33.0)
MCHC: 32.8 g/dL (ref 31.5–35.7)
MCV: 91 fL (ref 79–97)
MONOS ABS: 0.4 10*3/uL (ref 0.1–0.9)
Monocytes: 8 %
NEUTROS ABS: 3.2 10*3/uL (ref 1.4–7.0)
Neutrophils: 57 %
POTASSIUM: 4 mmol/L (ref 3.5–5.2)
Phosphorus: 3.7 mg/dL (ref 2.5–4.5)
Platelets: 299 10*3/uL (ref 150–379)
RBC: 4.38 x10E6/uL (ref 3.77–5.28)
RDW: 13.5 % (ref 12.3–15.4)
Sodium: 138 mmol/L (ref 134–144)
T3 Uptake Ratio: 28 % (ref 24–39)
T4, Total: 6.9 ug/dL (ref 4.5–12.0)
TRIGLYCERIDES: 64 mg/dL (ref 0–149)
TSH: 2.13 u[IU]/mL (ref 0.450–4.500)
Total Protein: 7.4 g/dL (ref 6.0–8.5)
Uric Acid: 5.3 mg/dL (ref 2.5–7.1)
VLDL Cholesterol Cal: 13 mg/dL (ref 5–40)
WBC: 5.5 10*3/uL (ref 3.4–10.8)

## 2017-08-23 LAB — VITAMIN D 25 HYDROXY (VIT D DEFICIENCY, FRACTURES): Vit D, 25-Hydroxy: 56 ng/mL (ref 30.0–100.0)

## 2017-08-23 LAB — HGB A1C W/O EAG: HEMOGLOBIN A1C: 5.1 % (ref 4.8–5.6)

## 2017-08-23 NOTE — Progress Notes (Signed)
Results reviewed with pt. Albumin and albumin/globulin ratio elevated, similar to previous. Possible high protein diet. GGT elevated, similar to last year. Gallbladder removed. LFTs normal. Asymptomatic r/t GGT or HFP. All other labs WNL. Diet and exercise recommendations for BP and health maintenance discussed. Routine f/u with pcp. Copy provided to pt. Results routed to pcp. No further questions/concerns.

## 2017-11-25 ENCOUNTER — Encounter: Payer: Self-pay | Admitting: Registered Nurse

## 2017-11-25 ENCOUNTER — Ambulatory Visit: Payer: Self-pay | Admitting: Registered Nurse

## 2017-11-25 VITALS — BP 133/79 | HR 65 | Temp 98.9°F

## 2017-11-25 DIAGNOSIS — J019 Acute sinusitis, unspecified: Secondary | ICD-10-CM

## 2017-11-25 DIAGNOSIS — H6591 Unspecified nonsuppurative otitis media, right ear: Secondary | ICD-10-CM

## 2017-11-25 MED ORDER — FLUTICASONE PROPIONATE 50 MCG/ACT NA SUSP: 1.0000 | Freq: Two times a day (BID) | NASAL | 0 refills | Status: AC

## 2017-11-25 MED ORDER — BENZONATATE 200 MG PO CAPS: 200.0000 mg | ORAL_CAPSULE | Freq: Two times a day (BID) | ORAL | 0 refills | Status: DC | PRN

## 2017-11-25 MED ORDER — ACETAMINOPHEN 500 MG PO TABS: 1000.0000 mg | ORAL_TABLET | Freq: Four times a day (QID) | ORAL | 0 refills | Status: AC | PRN

## 2017-11-25 MED ORDER — FLUCONAZOLE 150 MG PO TABS: ORAL_TABLET | ORAL | 0 refills | Status: DC

## 2017-11-25 MED ORDER — SALINE SPRAY 0.65 % NA SOLN: 2.0000 | NASAL | 0 refills | Status: AC

## 2017-11-25 NOTE — Progress Notes (Signed)
Subjective:    Patient ID: [name], female    DOB: Feb 27, 1951, 66 y.o.   MRN: [id number]  66y/o caucasian female established Pt c/o nasal congestion, runny nose, productive cough, maxillary sinus pressure, R ear pressure x2 days. Pt has been using Coricidin cold and cough med at home without relief of symptoms worried it will worsen over holidays when [facility] and [facility] closed.  Typically gets vaginal yeast infection if she takes oral antibiotics requested diflucan Rx in case she needs it over holiday break.      Review of Systems  Constitutional: Negative for activity change, appetite change, chills, diaphoresis, fatigue, fever and unexpected weight change.  HENT: Positive for congestion, ear pain, postnasal drip, rhinorrhea, sinus pressure and sinus pain. Negative for dental problem, drooling, ear discharge, facial swelling, hearing loss, mouth sores, nosebleeds, sneezing, sore throat, tinnitus, trouble swallowing and voice change.   Eyes: Negative for photophobia, pain, discharge, redness, itching and visual disturbance.  Respiratory: Positive for cough. Negative for choking, chest tightness, shortness of breath, wheezing and stridor.   Cardiovascular: Negative for chest pain, palpitations and leg swelling.  Gastrointestinal: Negative for abdominal distention, abdominal pain, blood in stool, constipation, diarrhea, nausea and vomiting.  Endocrine: Negative for cold intolerance and heat intolerance.  Genitourinary: Negative for difficulty urinating, dysuria and hematuria.  Musculoskeletal: Negative for arthralgias, back pain, gait problem, joint swelling, myalgias, neck pain and neck stiffness.  Skin: Negative for color change, pallor, rash and wound.  Allergic/Immunologic: Positive for environmental allergies. Negative for food allergies.  Neurological: Negative for dizziness, tremors, seizures, syncope, facial asymmetry, speech difficulty, weakness, light-headedness, numbness and  headaches.  Hematological: Negative for adenopathy. Does not bruise/bleed easily.  Psychiatric/Behavioral: Negative for agitation, behavioral problems, confusion and sleep disturbance.       Objective:   Physical Exam  Constitutional: She is oriented to person, place, and time. Vital signs are normal. She appears well-developed and well-nourished. She is active and cooperative.  Non-toxic appearance. She does not have a sickly appearance. She appears ill. No distress.  HENT:  Head: Normocephalic and atraumatic.  Right Ear: Hearing, external ear and ear canal normal. Tympanic membrane is erythematous and bulging. A middle ear effusion is present.  Left Ear: Hearing, external ear and ear canal normal. A middle ear effusion is present.  Nose: Mucosal edema and rhinorrhea present. No nose lacerations, sinus tenderness, nasal deformity, septal deviation or nasal septal hematoma. No epistaxis.  No foreign bodies. Right sinus exhibits no maxillary sinus tenderness and no frontal sinus tenderness. Left sinus exhibits no maxillary sinus tenderness and no frontal sinus tenderness.  Mouth/Throat: Uvula is midline and mucous membranes are normal. Mucous membranes are not pale, not dry and not cyanotic. She does not have dentures. No oral lesions. No trismus in the jaw. Normal dentition. No dental abscesses, uvula swelling, lacerations or dental caries. Posterior oropharyngeal edema and posterior oropharyngeal erythema present. No oropharyngeal exudate or tonsillar abscesses.  Cobblestoning posterior pharynx; bilateral TMs air fluid level clear; Right TM erythema peripherally and bulging; bilateral allergic shiners; nasal turbinates edema/erythema/clear discharge; pressure with palpation maxillary sinuses not tender  Eyes: Conjunctivae, EOM and lids are normal. Pupils are equal, round, and reactive to light. Right eye exhibits no chemosis, no discharge, no exudate and no hordeolum. No foreign body present in the  right eye. Left eye exhibits no chemosis, no discharge, no exudate and no hordeolum. No foreign body present in the left eye. Right conjunctiva is not injected. Right conjunctiva has  no hemorrhage. Left conjunctiva is not injected. Left conjunctiva has no hemorrhage. No scleral icterus. Right eye exhibits normal extraocular motion and no nystagmus. Left eye exhibits normal extraocular motion and no nystagmus. Right pupil is round and reactive. Left pupil is round and reactive. Pupils are equal.  Neck: Trachea normal, normal range of motion and phonation normal. Neck supple. No tracheal tenderness and no muscular tenderness present. No neck rigidity. No tracheal deviation, no edema, no erythema and normal range of motion present. No thyroid mass and no thyromegaly present.  Cardiovascular: Normal rate, regular rhythm, S1 normal, S2 normal, normal heart sounds and intact distal pulses. PMI is not displaced. Exam reveals no gallop and no friction rub.  No murmur heard. Pulmonary/Chest: Effort normal and breath sounds normal. No accessory muscle usage or stridor. No respiratory distress. She has no decreased breath sounds. She has no wheezes. She has no rhonchi. She has no rales. She exhibits no tenderness.  No cough observed in exam room; spoke full sentences without difficulty  Abdominal: Soft. Normal appearance. She exhibits no distension, no fluid wave and no ascites. There is no rigidity and no guarding.  Musculoskeletal: Normal range of motion. She exhibits no edema or tenderness.       Right shoulder: Normal.       Left shoulder: Normal.       Right elbow: Normal.      Left elbow: Normal.       Right hip: Normal.       Left hip: Normal.       Right knee: Normal.       Left knee: Normal.       Cervical back: Normal.       Thoracic back: Normal.       Lumbar back: Normal.       Right hand: Normal.       Left hand: Normal.  Lymphadenopathy:       Head (right side): No submental, no  submandibular, no tonsillar, no preauricular, no posterior auricular and no occipital adenopathy present.       Head (left side): No submental, no submandibular, no tonsillar, no preauricular, no posterior auricular and no occipital adenopathy present.    She has no cervical adenopathy.       Right cervical: No superficial cervical, no deep cervical and no posterior cervical adenopathy present.      Left cervical: No superficial cervical, no deep cervical and no posterior cervical adenopathy present.  Neurological: She is alert and oriented to person, place, and time. She has normal strength. She is not disoriented. She displays no atrophy and no tremor. No cranial nerve deficit or sensory deficit. She exhibits normal muscle tone. She displays no seizure activity. Coordination and gait normal. GCS eye subscore is 4. GCS verbal subscore is 5. GCS motor subscore is 6.  On/off exam table; in/out of chair without difficulty; gait sure and steady in hallway  Skin: Skin is warm, dry and intact. No abrasion, no bruising, no burn, no ecchymosis, no laceration, no lesion, no petechiae and no rash noted. She is not diaphoretic. No cyanosis or erythema. No pallor. Nails show no clubbing.  Psychiatric: She has a normal mood and affect. Her speech is normal and behavior is normal. Judgment and thought content normal. Cognition and memory are normal.  Nursing note and vitals reviewed.         Assessment & Plan:  A-acute rhinosinusitis, otitis media right nonsupportive acute  P-Restart flonase 1 spray  each nostril BID at home and electronic refill given to pharmacy of choice #1 RF0, saline 2 sprays each nostril q2h wa prn congestion 1 bottle given to patient from clinic stock.  If no improvement with 48 hours of saline and flonase use start augmentin 875mg  po BID x 10 days #20 RF0 dispensed from [facility] Hx vulvovaginal candidiasis with oral antibiotics electronic Rx diflucan 150mg  po start at onset of symptoms may  repeat once in 72 hours #2 RF0.  Honey with lemon po prn cough and/or cough lozenges po q2h prn cough Discussed eating yogurt with active cultures. Denied personal or family history of ENT cancer.  Shower BID especially prior to bed. No evidence of systemic bacterial infection, non toxic and well hydrated.  I do not see where any further testing or imaging is necessary at this time.   I will suggest supportive care, rest, good hygiene and encourage the patient to take adequate fluids.  The patient is to return to clinic or EMERGENCY ROOM if symptoms worsen or change significantly.  Exitcare handout on sinusitis, nonallergic rhinitis and sinus rinse given to patient.  Patient verbalized agreement and understanding of treatment plan and had no further questions at this time.   P2:  Hand washing and cover cough  Augmentin 875mg  po BID x 10 days #20 RF0 dispnesed from [facility].  Tylenol 1000mg  po QID prn pain at home.    No evidence of invasive bacterial infection, non toxic and well hydrated.  This is most likely self limiting viral infection.  I do not see where any further testing or imaging is necessary at this time.   I will suggest supportive care, rest, good hygiene and encourage the patient to take adequate fluids.  The patient is to return to clinic or EMERGENCY ROOM if symptoms worsen or change significantly e.g. ear pain, fever, purulent discharge from ears or bleeding.  Exitcare handout on otitis media given to patient.  Patient verbalized agreement and understanding of treatment plan.

## 2017-11-26 NOTE — Patient Instructions (Addendum)
Nonallergic Rhinitis Nonallergic rhinitis is a condition that causes symptoms that affect the nose, such as a runny nose and a stuffed-up nose (nasal congestion) that can make it hard to breathe through the nose. This condition is different from having an allergy (allergic rhinitis). Allergic rhinitis occurs when the body's defense system (immune system) reacts to a substance that you are allergic to (allergen), such as pollen, pet dander, mold, or dust. Nonallergic rhinitis has many similar symptoms, but it is not caused by allergens. Nonallergic rhinitis can be a short-term or long-term problem. What are the causes? This condition can be caused by many different things. Some common types of nonallergic rhinitis include: Infectious rhinitis  This is usually due to an infection in the upper respiratory tract. Vasomotor rhinitis  This is the most common type of long-term nonallergic rhinitis.  It is caused by too much blood flow through the nose, which makes the tissue inside of the nose swell.  Symptoms are often triggered by strong odors, cold air, stress, drinking alcohol, cigarette smoke, or changes in the weather. Occupational rhinitis  This type is caused by triggers in the workplace, such as chemicals, dusts, animal dander, or air pollution. Hormonal rhinitis  This type occurs in women as a result of an increase in the female hormone estrogen.  It may occur during pregnancy, puberty, and menstrual cycles.  Symptoms improve when estrogen levels drop. Drug-induced rhinitis Several drugs can cause nonallergic rhinitis, including:  Medicines that are used to treat high blood pressure, heart disease, and Parkinson disease.  Aspirin and NSAIDs.  Over-the-counter nasal decongestant sprays. These can cause a type of nonallergic rhinitis (rhinitis medicamentosa) when they are used for more than a few days.  Nonallergic rhinitis with eosinophilia syndrome (NARES)  This type is caused  by having too much of a certain type of white blood cell (eosinophil). Nonallergic rhinitis can also be caused by a reaction to eating hot or spicy foods. This does not usually cause long-term symptoms. In some cases, the cause of nonallergic rhinitis is not known. What increases the risk? You are more likely to develop this condition if:  You are 30-60 years of age.  You are a woman. Women are twice as likely to have this condition.  What are the signs or symptoms? Common symptoms of this condition include:  Nasal congestion.  Runny nose.  The feeling of mucus going down the back of the throat (postnasal drip).  Trouble sleeping at night and daytime sleepiness.  Less common symptoms include:  Sneezing.  Coughing.  Itchy nose.  Bloodshot eyes.  How is this diagnosed? This condition may be diagnosed based on:  Your symptoms and medical history.  A physical exam.  Allergy testing to rule out allergic rhinitis. You may have skin tests or blood tests.  In some cases, the health care provider may take a swab of nasal secretions to look for an increased number of eosinophils. This would be done to confirm a diagnosis of NARES. How is this treated? Treatment for this condition depends on the cause. No single treatment works for everyone. Work with your health care provider to find the best treatment for you. Treatment may include:  Avoiding the things that trigger your symptoms.  Using medicines to relieve congestion, such as: ? Steroid nasal spray. There are many types. You may need to try a few to find out which one works best. ? Decongestant medicine. This may be an oral medicine or a nasal spray. These   medicines are only used for a short time.  Using medicines to relieve a runny nose. These may include antihistamine medicines or anticholinergic nasal sprays.  Surgery to remove tissue from inside the nose may be needed in severe cases if the condition has not improved  after 6-12 months of medical treatment. Follow these instructions at home:  Take or use over-the-counter and prescription medicines only as told by your health care provider. Do not stop using your medicine even if you start to feel better.  Use salt-water (saline) rinses or other solutions (nasal washes or irrigations) to wash or rinse out the inside of your nose as told by your health care provider.  Do not take NSAIDs or medicines that contain aspirin if they make your symptoms worse.  Do not drink alcohol if it makes your symptoms worse.  Do not use any tobacco products, such as cigarettes, chewing tobacco, and e-cigarettes. If you need help quitting, ask your health care provider.  Avoid secondhand smoke.  Get some exercise every day. Exercise may help reduce symptoms of nonallergic rhinitis for some people. Ask your health care provider how much exercise and what types of exercise are safe for you.  Sleep with the head of your bed raised (elevated). This may reduce nighttime nasal congestion.  Keep all follow-up visits as told by your health care provider. This is important. Contact a health care provider if:  You have a fever.  Your symptoms are getting worse at home.  Your symptoms are not responding to medicine.  You develop new symptoms, especially a headache or nosebleed. This information is not intended to replace advice given to you by your health care provider. Make sure you discuss any questions you have with your health care provider. Document Released: 03/16/2016 Document Revised: 04/30/2016 Document Reviewed: 02/13/2016 Elsevier Interactive Patient Education  2018 [employer]. Sinus Rinse What is a sinus rinse? A sinus rinse is a simple home treatment that is used to rinse your sinuses with a sterile mixture of salt and water (saline solution). Sinuses are air-filled spaces in your skull behind the bones of your face and forehead that open into your nasal  cavity. You will use the following:  Saline solution.  Neti pot or spray bottle. This releases the saline solution into your nose and through your sinuses. Neti pots and spray bottles can be purchased at [employer], a health food store, or online.  When would I do a sinus rinse? A sinus rinse can help to clear mucus, dirt, dust, or pollen from the nasal cavity. You may do a sinus rinse when you have a cold, a virus, nasal allergy symptoms, a sinus infection, or stuffiness in the nose or sinuses. If you are considering a sinus rinse:  Ask your child's health care provider before performing a sinus rinse on your child.  Do not do a sinus rinse if you have had ear or nasal surgery, ear infection, or blocked ears.  How do I do a sinus rinse?  Wash your hands.  Disinfect your device according to the directions provided and then dry it.  Use the solution that comes with your device or one that is sold separately in stores. Follow the mixing directions on the package.  Fill your device with the amount of saline solution as directed by the device instructions.  Stand over a sink and tilt your head sideways over the sink.  Place the spout of the device in your upper nostril (the one  closer to the ceiling).  Gently pour or squeeze the saline solution into the nasal cavity. The liquid should drain to the lower nostril if you are not overly congested.  Gently blow your nose. Blowing too hard may cause ear pain.  Repeat in the other nostril.  Clean and rinse your device with clean water and then air-dry it. Are there risks of a sinus rinse? Sinus rinse is generally very safe and effective. However, there are a few risks, which include:  A burning sensation in the sinuses. This may happen if you do not make the saline solution as directed. Make sure to follow all directions when making the saline solution.  Infection from contaminated water. This is rare, but possible.  Nasal  irritation.  This information is not intended to replace advice given to you by your health care provider. Make sure you discuss any questions you have with your health care provider. Document Released: 06/20/2014 Document Revised: 10/20/2016 Document Reviewed: 04/10/2014 Elsevier Interactive Patient Education  2017 [employer] Inc. Otitis Media, Adult Otitis media occurs when there is inflammation and fluid in the middle ear. Your middle ear is a part of the ear that contains bones for hearing as well as air that helps send sounds to your brain. What are the causes? This condition is caused by a blockage in the eustachian tube. This tube drains fluid from the ear to the back of the nose (nasopharynx). A blockage in this tube can be caused by an object or by swelling (edema) in the tube. Problems that can cause a blockage include:  A cold or other upper respiratory infection.  Allergies.  An irritant, such as tobacco smoke.  Enlarged adenoids. The adenoids are areas of soft tissue located high in the back of the throat, behind the nose and the roof of the mouth.  A mass in the nasopharynx.  Damage to the ear caused by pressure changes (barotrauma).  What are the signs or symptoms? Symptoms of this condition include:  Ear pain.  A fever.  Decreased hearing.  A headache.  Tiredness (lethargy).  Fluid leaking from the ear.  Ringing in the ear.  How is this diagnosed? This condition is diagnosed with a physical exam. During the exam your health care provider will use an instrument called an otoscope to look into your ear and check for redness, swelling, and fluid. He or she will also ask about your symptoms. Your health care provider may also order tests, such as:  A test to check the movement of the eardrum (pneumatic otoscopy). This test is done by squeezing a small amount of air into the ear.  A test that changes air pressure in the middle ear to check how well the eardrum  moves and whether the eustachian tube is working (tympanogram).  How is this treated? This condition usually goes away on its own within 3-5 days. But if the condition is caused by a bacteria infection and does not go away own its own, or keeps coming back, your health care provider may:  Prescribe antibiotic medicines to treat the infection.  Prescribe or recommend medicines to control pain.  Follow these instructions at home:  Take over-the-counter and prescription medicines only as told by your health care provider.  If you were prescribed an antibiotic medicine, take it as told by your health care provider. Do not stop taking the antibiotic even if you start to feel better.  Keep all follow-up visits as told by your health care  provider. This is important. Contact a health care provider if:  You have bleeding from your nose.  There is a lump on your neck.  You are not getting better in 5 days.  You feel worse instead of better. Get help right away if:  You have severe pain that is not controlled with medicine.  You have swelling, redness, or pain around your ear.  You have stiffness in your neck.  A part of your face is paralyzed.  The bone behind your ear (mastoid) is tender when you touch it.  You develop a severe headache. Summary  Otitis media is redness, soreness, and swelling of the middle ear.  This condition usually goes away on its own within 3-5 days.  If the problem does not go away in 3-5 days, your health care provider may prescribe or recommend medicines to treat your symptoms.  If you were prescribed an antibiotic medicine, take it as told by your health care provider. This information is not intended to replace advice given to you by your health care provider. Make sure you discuss any questions you have with your health care provider. Document Released: 08/28/2004 Document Revised: 11/13/2016 Document Reviewed: 11/13/2016 Elsevier Interactive  Patient Education  2018 Else[employer] Inc. Sinus Rinse What is a sinus rinse? A sinus rinse is a simple home treatment that is used to rinse your sinuses with a sterile mixture of salt and water (saline solution). Sinuses are air-filled spaces in your skull behind the bones of your face and forehead that open into your nasal cavity. You will use the following:  Saline solution.  Neti pot or spray bottle. This releases the saline solution into your nose and through your sinuses. Neti pots and spray bottles can be purchased at [facility], a health food store, or online.  When would I do a sinus rinse? A sinus rinse can help to clear mucus, dirt, dust, or pollen from the nasal cavity. You may do a sinus rinse when you have a cold, a virus, nasal allergy symptoms, a sinus infection, or stuffiness in the nose or sinuses. If you are considering a sinus rinse:  Ask your child's health care provider before performing a sinus rinse on your child.  Do not do a sinus rinse if you have had ear or nasal surgery, ear infection, or blocked ears.  How do I do a sinus rinse?  Wash your hands.  Disinfect your device according to the directions provided and then dry it.  Use the solution that comes with your device or one that is sold separately in stores. Follow the mixing directions on the package.  Fill your device with the amount of saline solution as directed by the device instructions.  Stand over a sink and tilt your head sideways over the sink.  Place the spout of the device in your upper nostril (the one closer to the ceiling).  Gently pour or squeeze the saline solution into the nasal cavity. The liquid should drain to the lower nostril if you are not overly congested.  Gently blow your nose. Blowing too hard may cause ear pain.  Repeat in the other nostril.  Clean and rinse your device with clean water and then air-dry it. Are there risks of a sinus rinse? Sinus rinse is  generally very safe and effective. However, there are a few risks, which include:  A burning sensation in the sinuses. This may happen if you do not make the saline solution as directed. Make  sure to follow all directions when making the saline solution.  Infection from contaminated water. This is rare, but possible.  Nasal irritation.  This information is not intended to replace advice given to you by your health care provider. Make sure you discuss any questions you have with your health care provider. Document Released: 06/20/2014 Document Revised: 10/20/2016 Document Reviewed: 04/10/2014 Elsevier Interactive Patient Education  2017 Else[employer] Inc. Sinusitis, Adult Sinusitis is soreness and inflammation of your sinuses. Sinuses are hollow spaces in the bones around your face. Your sinuses are located:  Around your eyes.  In the middle of your forehead.  Behind your nose.  In your cheekbones.  Your sinuses and nasal passages are lined with a stringy fluid (mucus). Mucus normally drains out of your sinuses. When your nasal tissues become inflamed or swollen, the mucus can become trapped or blocked so air cannot flow through your sinuses. This allows bacteria, viruses, and funguses to grow, which leads to infection. Sinusitis can develop quickly and last for 7?10 days (acute) or for more than 12 weeks (chronic). Sinusitis often develops after a cold. What are the causes? This condition is caused by anything that creates swelling in the sinuses or stops mucus from draining, including:  Allergies.  Asthma.  Bacterial or viral infection.  Abnormally shaped bones between the nasal passages.  Nasal growths that contain mucus (nasal polyps).  Narrow sinus openings.  Pollutants, such as chemicals or irritants in the air.  A foreign object stuck in the nose.  A fungal infection. This is rare.  What increases the risk? The following factors may make you more likely to develop this  condition:  Having allergies or asthma.  Having had a recent cold or respiratory tract infection.  Having structural deformities or blockages in your nose or sinuses.  Having a weak immune system.  Doing a lot of swimming or diving.  Overusing nasal sprays.  Smoking.  What are the signs or symptoms? The main symptoms of this condition are pain and a feeling of pressure around the affected sinuses. Other symptoms include:  Upper toothache.  Earache.  Headache.  Bad breath.  Decreased sense of smell and taste.  A cough that may get worse at night.  Fatigue.  Fever.  Thick drainage from your nose. The drainage is often green and it may contain pus (purulent).  Stuffy nose or congestion.  Postnasal drip. This is when extra mucus collects in the throat or back of the nose.  Swelling and warmth over the affected sinuses.  Sore throat.  Sensitivity to light.  How is this diagnosed? This condition is diagnosed based on symptoms, a medical history, and a physical exam. To find out if your condition is acute or chronic, your health care provider may:  Look in your nose for signs of nasal polyps.  Tap over the affected sinus to check for signs of infection.  View the inside of your sinuses using an imaging device that has a light attached (endoscope).  If your health care provider suspects that you have chronic sinusitis, you may also:  Be tested for allergies.  Have a sample of mucus taken from your nose (nasal culture) and checked for bacteria.  Have a mucus sample examined to see if your sinusitis is related to an allergy.  If your sinusitis does not respond to treatment and it lasts longer than 8 weeks, you may have an MRI or CT scan to check your sinuses. These scans also help to determine  how severe your infection is. In rare cases, a bone biopsy may be done to rule out more serious types of fungal sinus disease. How is this treated? Treatment for  sinusitis depends on the cause and whether your condition is chronic or acute. If a virus is causing your sinusitis, your symptoms will go away on their own within 10 days. You may be given medicines to relieve your symptoms, including:  Topical nasal decongestants. They shrink swollen nasal passages and let mucus drain from your sinuses.  Antihistamines. These drugs block inflammation that is triggered by allergies. This can help to ease swelling in your nose and sinuses.  Topical nasal corticosteroids. These are nasal sprays that ease inflammation and swelling in your nose and sinuses.  Nasal saline washes. These rinses can help to get rid of thick mucus in your nose.  If your condition is caused by bacteria, you will be given an antibiotic medicine. If your condition is caused by a fungus, you will be given an antifungal medicine. Surgery may be needed to correct underlying conditions, such as narrow nasal passages. Surgery may also be needed to remove polyps. Follow these instructions at home: Medicines  Take, use, or apply over-the-counter and prescription medicines only as told by your health care provider. These may include nasal sprays.  If you were prescribed an antibiotic medicine, take it as told by your health care provider. Do not stop taking the antibiotic even if you start to feel better. Hydrate and Humidify  Drink enough water to keep your urine clear or pale yellow. Staying hydrated will help to thin your mucus.  Use a cool mist humidifier to keep the humidity level in your home above 50%.  Inhale steam for 10-15 minutes, 3-4 times a day or as told by your health care provider. You can do this in the bathroom while a hot shower is running.  Limit your exposure to cool or dry air. Rest  Rest as much as possible.  Sleep with your head raised (elevated).  Make sure to get enough sleep each night. General instructions  Apply a warm, moist washcloth to your face 3-4  times a day or as told by your health care provider. This will help with discomfort.  Wash your hands often with soap and water to reduce your exposure to viruses and other germs. If soap and water are not available, use hand sanitizer.  Do not smoke. Avoid being around people who are smoking (secondhand smoke).  Keep all follow-up visits as told by your health care provider. This is important. Contact a health care provider if:  You have a fever.  Your symptoms get worse.  Your symptoms do not improve within 10 days. Get help right away if:  You have a severe headache.  You have persistent vomiting.  You have pain or swelling around your face or eyes.  You have vision problems.  You develop confusion.  Your neck is stiff.  You have trouble breathing. This information is not intended to replace advice given to you by your health care provider. Make sure you discuss any questions you have with your health care provider. Document Released: 11/23/2005 Document Revised: 07/19/2016 Document Reviewed: 09/18/2015 Elsevier Interactive Patient Education  2018 [employer] Vaginal Yeast infection, Adult Vaginal yeast infection is a condition that causes soreness, swelling, and redness (inflammation) of the vagina. It also causes vaginal discharge. This is a common condition. Some women get this infection frequently. What are the causes? This condition  is caused by a change in the normal balance of the yeast (candida) and bacteria that live in the vagina. This change causes an overgrowth of yeast, which causes the inflammation. What increases the risk? This condition is more likely to develop in:  Women who take antibiotic medicines.  Women who have diabetes.  Women who take birth control pills.  Women who are pregnant.  Women who douche often.  Women who have a weak defense (immune) system.  Women who have been taking steroid medicines for a long time.  Women who  frequently wear tight clothing.  What are the signs or symptoms? Symptoms of this condition include:  White, thick vaginal discharge.  Swelling, itching, redness, and irritation of the vagina. The lips of the vagina (vulva) may be affected as well.  Pain or a burning feeling while urinating.  Pain during sex.  How is this diagnosed? This condition is diagnosed with a medical history and physical exam. This will include a pelvic exam. Your health care provider will examine a sample of your vaginal discharge under a microscope. Your health care provider may send this sample for testing to confirm the diagnosis. How is this treated? This condition is treated with medicine. Medicines may be over-the-counter or prescription. You may be told to use one or more of the following:  Medicine that is taken orally.  Medicine that is applied as a cream.  Medicine that is inserted directly into the vagina (suppository).  Follow these instructions at home:  Take or apply over-the-counter and prescription medicines only as told by your health care provider.  Do not have sex until your health care provider has approved. Tell your sex partner that you have a yeast infection. That person should go to his or her health care provider if he or she develops symptoms.  Do not wear tight clothes, such as pantyhose or tight pants.  Avoid using tampons until your health care provider approves.  Eat more yogurt. This may help to keep your yeast infection from returning.  Try taking a sitz bath to help with discomfort. This is a warm water bath that is taken while you are sitting down. The water should only come up to your hips and should cover your buttocks. Do this 3-4 times per day or as told by your health care provider.  Do not douche.  Wear breathable, cotton underwear.  If you have diabetes, keep your blood sugar levels under control. Contact a health care provider if:  You have a  fever.  Your symptoms go away and then return.  Your symptoms do not get better with treatment.  Your symptoms get worse.  You have new symptoms.  You develop blisters in or around your vagina.  You have blood coming from your vagina and it is not your menstrual period.  You develop pain in your abdomen. This information is not intended to replace advice given to you by your health care provider. Make sure you discuss any questions you have with your health care provider. Document Released: 09/02/2005 Document Revised: 05/06/2016 Document Reviewed: 05/27/2015 Elsevier Interactive Patient Education  2018 [employer].

## 2017-12-09 ENCOUNTER — Telehealth: Payer: Self-pay | Admitting: *Deleted

## 2017-12-09 NOTE — Telephone Encounter (Signed)
Fax referral from Gweneth DimitriWendy McNeill, [facility] sent to scheduling

## 2017-12-13 ENCOUNTER — Telehealth: Payer: Self-pay

## 2017-12-13 NOTE — Telephone Encounter (Signed)
SENT REFERRAL TO SCHEDULING 

## 2018-02-22 NOTE — Progress Notes (Signed)
Dillon Bjorkeferring-Wendy McNeill MD Reason for referral-Murmur  HPI: 67 yo female for evaluation of murmur at request of Gweneth DimitriWendy McNeill MD. Patient was seen in December for upper respiratory infection and felt to have a murmur.  Cardiology asked to evaluate.  Patient denies dyspnea on exertion, orthopnea, PND, pedal edema, palpitations or syncope.  Current Outpatient Medications  Medication Sig Dispense Refill  . amLODipine (NORVASC) 10 MG tablet Take 10 mg by mouth every morning.     Marland Kitchen. atorvastatin (LIPITOR) 10 MG tablet Take 10 mg by mouth every morning.    . Budesonide (UCERIS PO) Take by mouth.    . Calcium-Magnesium-Vitamin D (CALCIUM 1200+D3 PO) Take 1 tablet by mouth daily.    . colestipol (COLESTID) 1 g tablet TAKE 1 TABLET BY MOUTH TWICE A DAY FOR 30 DAYS  1  . cycloSPORINE (RESTASIS) 0.05 % ophthalmic emulsion Place 1 drop into both eyes 2 (two) times daily.    . ferrous sulfate 325 (65 FE) MG tablet Take 1 tablet (325 mg total) by mouth 3 (three) times daily after meals.  3  . fluconazole (DIFLUCAN) 150 MG tablet Take 1 tablet at onset of symptoms by mouth and may repeat in 72 hours if needed 2 tablet 0  . hydrochlorothiazide (MICROZIDE) 12.5 MG capsule 1-2 CAPSULES ONCE A DAY ORALLY 90 DAYS  3  . metoprolol succinate (TOPROL-XL) 50 MG 24 hr tablet Take 50 mg by mouth every morning. Take with or immediately following a meal.    . Multiple Vitamin (MULTIVITAMIN) tablet Take 1 tablet by mouth daily.    . Omega-3 Fatty Acids (FISH OIL) 1000 MG CAPS Take 2 capsules by mouth daily.    . valACYclovir (VALTREX) 500 MG tablet 1 TAB IN THE EVENING ONCE A DAY ORALLY 90 DAYS  3  . zolpidem (AMBIEN) 10 MG tablet 1/2 TO 1 TAB BEFORE BEDTIME AS NEEDED AT NIGHT AS NEEDED (1-2 TIMES A WEEK) ORALLY 30 DAYS  0  . benzonatate (TESSALON) 200 MG capsule Take 1 capsule (200 mg total) by mouth 2 (two) times daily as needed for cough. (Patient not taking: Reported on 03/04/2018) 30 capsule 0  . fluticasone  (FLONASE) 50 MCG/ACT nasal spray Place 1 spray into both nostrils 2 (two) times daily. 16 g 0  . polyethylene glycol (MIRALAX / GLYCOLAX) packet Take 17 g by mouth 2 (two) times daily. (Patient not taking: Reported on 03/04/2018) 14 each 0  . sodium chloride (OCEAN) 0.65 % SOLN nasal spray Place 2 sprays into both nostrils every 2 (two) hours while awake.  0   No current facility-administered medications for this visit.     Allergies  Allergen Reactions  . Other Hives    Dyes in medications     Past Medical History:  Diagnosis Date  . Arthritis    hands  . Dry eyes   . GERD (gastroesophageal reflux disease)   . Gluten free diet   . History of colitis   . HSV-2 infection   . Hyperlipidemia   . Hypertension     Past Surgical History:  Procedure Laterality Date  . CARPAL TUNNEL RELEASE Left 07/12/2014   Procedure: LEFT CARPAL TUNNEL RELEASE;  Surgeon: Sharma CovertFred W Ortmann, MD;  [facility] [place];  Service: Orthopedics;  Laterality: Left;  . CHOLECYSTECTOMY  2005  . DX LAPAROSCOPY/ EXPLORATORY LAPAROTOMY/ ABDOMINAL MYOMECTOMY/ FULGERATION ENDOMETRIOSIS  10-23-2003  . HYSTEROSCOPY W/D&C  04/ 2004   W/  POLYPECTOMY  . TOTAL HIP ARTHROPLASTY Right  07-23-2009  . TOTAL HIP ARTHROPLASTY Left 12/03/2014   Procedure: LEFT TOTAL HIP ARTHROPLASTY ANTERIOR APPROACH;  Surgeon: Shelda Pal, MD;  Location: [facility];  Service: Orthopedics;  Laterality: Left;  Marland Kitchen VAGINAL HYSTERECTOMY  03-23-2006   w/ bilateral salpingoophorectomy/  posterior rectocele repair/  mccall culdoplasty    Social History   Socioeconomic History  . Marital status: Married    Spouse name: Not on file  . Number of children: Not on file  . Years of education: Not on file  . Highest education level: Not on file  Occupational History  . Not on file  Social Needs  . Financial resource strain: Not on file  . Food insecurity:    Worry: Not on file    Inability: Not on file  . Transportation needs:     Medical: Not on file    Non-medical: Not on file  Tobacco Use  . Smoking status: Never Smoker  . Smokeless tobacco: Never Used  Substance and Sexual Activity  . Alcohol use: No  . Drug use: No  . Sexual activity: Not on file    Comment: hysterectomy  Lifestyle  . Physical activity:    Days per week: Not on file    Minutes per session: Not on file  . Stress: Not on file  Relationships  . Social connections:    Talks on phone: Not on file    Gets together: Not on file    Attends religious service: Not on file    Active member of club or organization: Not on file    Attends meetings of clubs or organizations: Not on file    Relationship status: Not on file  . Intimate partner violence:    Fear of current or ex partner: Not on file    Emotionally abused: Not on file    Physically abused: Not on file    Forced sexual activity: Not on file  Other Topics Concern  . Not on file  Social History Narrative  . Not on file    Family History  Problem Relation Age of Onset  . Hypertension Mother   . Thyroid disease Sister     ROS: no fevers or chills, productive cough, hemoptysis, dysphasia, odynophagia, melena, hematochezia, dysuria, hematuria, rash, seizure activity, orthopnea, PND, pedal edema, claudication. Remaining systems are negative.  Physical Exam:   Blood pressure 118/68, pulse 60, height 5\' 1"  (1.549 m), weight 132 lb 12.8 oz (60.2 kg), last menstrual period 07/07/2009.  General:  Well developed/well nourished in NAD Skin warm/dry Patient not depressed No peripheral clubbing Back-normal HEENT-normal/normal eyelids Neck supple/normal carotid upstroke bilaterally; no bruits; no JVD; no thyromegaly chest - CTA/ normal expansion CV - RRR/normal S1 and S2; no murmurs, rubs or gallops;  PMI nondisplaced Abdomen -NT/ND, no HSM, no mass, + bowel sounds, no bruit 2+ femoral pulses, no bruits Ext-no edema, chords, 2+ DP Neuro-grossly nonfocal  ECG -normal sinus rhythm  at a rate of 60.  No ST changes.  Personally reviewed  A/P  1 murmur-no murmur was noted on examination.  Patient is not having cardiac symptoms including no dyspnea, chest pain, palpitations or syncope.  Electrocardiogram is normal.  I will not pursue further evaluation.  2 hypertension-blood pressure is controlled.  Continue present medications.  3 hyperlipidemia-managed by primary care.  Olga Millers, MD

## 2018-03-04 ENCOUNTER — Encounter: Payer: Self-pay | Admitting: Cardiology

## 2018-03-04 ENCOUNTER — Ambulatory Visit: Payer: No Typology Code available for payment source | Admitting: Cardiology

## 2018-03-04 VITALS — BP 118/68 | HR 60 | Ht 61.0 in | Wt 132.8 lb

## 2018-03-04 DIAGNOSIS — I10 Essential (primary) hypertension: Secondary | ICD-10-CM

## 2018-03-04 DIAGNOSIS — R011 Cardiac murmur, unspecified: Secondary | ICD-10-CM | POA: Diagnosis not present

## 2018-03-04 DIAGNOSIS — E7800 Pure hypercholesterolemia, unspecified: Secondary | ICD-10-CM

## 2018-03-04 NOTE — Patient Instructions (Signed)
Your physician recommends that you schedule a follow-up appointment in: AS NEEDED  

## 2018-09-09 ENCOUNTER — Ambulatory Visit: Payer: No Typology Code available for payment source

## 2018-12-27 ENCOUNTER — Ambulatory Visit: Payer: Self-pay | Admitting: Registered Nurse

## 2018-12-27 ENCOUNTER — Encounter: Payer: Self-pay | Admitting: Registered Nurse

## 2018-12-27 VITALS — BP 115/69 | HR 58 | Temp 98.1°F

## 2018-12-27 DIAGNOSIS — S161XXA Strain of muscle, fascia and tendon at neck level, initial encounter: Secondary | ICD-10-CM

## 2018-12-27 DIAGNOSIS — S5011XA Contusion of right forearm, initial encounter: Secondary | ICD-10-CM

## 2018-12-27 DIAGNOSIS — W010XXA Fall on same level from slipping, tripping and stumbling without subsequent striking against object, initial encounter: Secondary | ICD-10-CM

## 2018-12-27 MED ORDER — MENTHOL (TOPICAL ANALGESIC) 4 % EX GEL: 1.0000 "application " | Freq: Four times a day (QID) | CUTANEOUS | 0 refills | Status: AC | PRN

## 2018-12-27 MED ORDER — ACETAMINOPHEN 500 MG PO TABS: 1000.0000 mg | ORAL_TABLET | Freq: Four times a day (QID) | ORAL | 0 refills | Status: AC | PRN

## 2018-12-27 NOTE — Progress Notes (Signed)
Subjective:    Patient ID: [name], female    DOB: 22-Dec-1950, 67 y.o.   MRN: [id number]  68 y/o Caucasian established female pt that fell at work yesterday. Believes she tripped over a cord in the floor and fell forward onto L knee and R elbow. Today with R elbow pain, R shoulder and scapular pain that extends up into R neck. Describes pain as tight, 4/10. Denies LOC or hitting head. No dizziness, lightheadedness, nausea, vision changes, loss of bowel/bladder control, saddle paresthesias, hand/leg/knee/elbow weakness post fall or prior. Denies L knee pain. Home treatment includes Salon Pas pad applied this morning right shoulder blade. Used ice yesterday. Motrin po prn at home.  Takes valtrex daily but motrin prn not daily.  Right hand dominant   Review of Systems  Constitutional: Negative for activity change, appetite change, chills, diaphoresis, fatigue and fever.  HENT: Negative for trouble swallowing and voice change.   Eyes: Negative for photophobia and visual disturbance.  Respiratory: Negative for cough, shortness of breath, wheezing and stridor.   Cardiovascular: Negative for chest pain, palpitations and leg swelling.  Gastrointestinal: Negative for abdominal distention, diarrhea, nausea and vomiting.  Endocrine: Negative for cold intolerance and heat intolerance.  Genitourinary: Negative for difficulty urinating and enuresis.  Musculoskeletal: Positive for arthralgias, back pain, myalgias and neck pain. Negative for gait problem, joint swelling and neck stiffness.  Skin: Negative for color change, pallor, rash and wound.  Allergic/Immunologic: Positive for environmental allergies and food allergies.  Neurological: Negative for dizziness, tremors, seizures, syncope, facial asymmetry, speech difficulty, weakness, light-headedness, numbness and headaches.  Hematological: Negative for adenopathy. Does not bruise/bleed easily.  Psychiatric/Behavioral: Negative for agitation,  confusion and sleep disturbance.       Objective:   Physical Exam Vitals signs and nursing note reviewed.  Constitutional:      General: She is not in acute distress.    Appearance: Normal appearance. She is well-developed and well-groomed. She is not ill-appearing, toxic-appearing or diaphoretic.  HENT:     Head: Normocephalic and atraumatic.     Jaw: There is normal jaw occlusion.     Right Ear: Hearing and external ear normal.     Left Ear: Hearing and external ear normal.     Nose: Nose normal. No congestion or rhinorrhea.     Mouth/Throat:     Lips: Pink. No lesions.     Mouth: Mucous membranes are moist.     Pharynx: Oropharynx is clear. Uvula midline. No pharyngeal swelling, oropharyngeal exudate or posterior oropharyngeal erythema.     Tonsils: No tonsillar exudate.  Eyes:     General: Lids are normal. No visual field deficit or scleral icterus.       Right eye: No discharge.        Left eye: No discharge.     Extraocular Movements: Extraocular movements intact.     Conjunctiva/sclera: Conjunctivae normal.     Pupils: Pupils are equal, round, and reactive to light.  Neck:     Musculoskeletal: Normal range of motion and neck supple. Normal range of motion. Pain with movement and muscular tenderness present. No edema, erythema, neck rigidity, crepitus or spinous process tenderness.     Trachea: Trachea and phonation normal.      Comments: Full c-spine arom; bilateral trapezius tight; lateral bending and  Rotation equal bilateral able to line chin up with shoulder; full stength 5/5 with some end point tightness/pain radiating down right scapula Cardiovascular:     Rate and  Rhythm: Normal rate and regular rhythm.     Pulses: Normal pulses.          Radial pulses are 2+ on the right side and 2+ on the left side.     Heart sounds: Normal heart sounds.  Pulmonary:     Effort: Pulmonary effort is normal. No respiratory distress.     Breath sounds: Normal breath sounds and air  entry. No stridor, decreased air movement or transmitted upper airway sounds. No decreased breath sounds, wheezing, rhonchi or rales.  Abdominal:     General: Abdomen is flat.     Palpations: Abdomen is soft.  Musculoskeletal: Normal range of motion.        General: No swelling or deformity.     Right shoulder: She exhibits tenderness, pain and spasm. She exhibits normal range of motion, no bony tenderness, no swelling, no effusion, no crepitus, no deformity, no laceration, normal pulse and normal strength.     Left shoulder: Normal.     Right elbow: She exhibits normal range of motion, no swelling, no effusion, no deformity and no laceration. Tenderness found. Olecranon process tenderness noted. No radial head, no medial epicondyle and no lateral epicondyle tenderness noted.     Left elbow: Normal.     Right wrist: Normal.     Left wrist: Normal.     Right hip: Normal.     Left hip: Normal.     Right knee: Normal.     Left knee: Normal.     Right ankle: Normal.     Left ankle: Normal.     Cervical back: She exhibits tenderness, pain and spasm. She exhibits normal range of motion, no bony tenderness, no swelling, no edema, no deformity, no laceration and normal pulse.     Thoracic back: She exhibits tenderness. She exhibits normal range of motion, no bony tenderness, no swelling, no edema and no deformity.     Lumbar back: Normal.       Back:     Right upper arm: Normal.     Left upper arm: Normal.     Right forearm: Normal.     Left forearm: Normal.       Arms:     Right hand: Normal.     Left hand: Normal.     Right lower leg: No edema.     Left lower leg: No edema.  Lymphadenopathy:     Head:     Right side of head: No submental, preauricular or posterior auricular adenopathy.     Left side of head: No submental, preauricular or posterior auricular adenopathy.     Cervical: No cervical adenopathy.     Right cervical: No superficial, deep or posterior cervical adenopathy.     Left cervical: No superficial, deep or posterior cervical adenopathy.  Skin:    General: Skin is warm and dry.     Capillary Refill: Capillary refill takes less than 2 seconds.     Coloration: Skin is not ashen, cyanotic, jaundiced, mottled, pale or sallow.     Findings: Signs of injury present. No abrasion, abscess, bruising, burn, ecchymosis, erythema, laceration, lesion, petechiae, rash or wound.  Neurological:     General: No focal deficit present.     Mental Status: She is alert and oriented to person, place, and time. Mental status is at baseline.     GCS: GCS eye subscore is 4. GCS verbal subscore is 5. GCS motor subscore is 6.     Cranial  Nerves: Cranial nerves are intact. No cranial nerve deficit, dysarthria or facial asymmetry.     Sensory: Sensation is intact. No sensory deficit.     Motor: Motor function is intact. No weakness, tremor, atrophy, abnormal muscle tone or seizure activity.     Coordination: Coordination is intact. Coordination normal.     Gait: Gait is intact. Gait normal.     Comments: Gait sure and steady in clinic and hall; on/off exam table and in/out of chair without difficulty; bilateral hand/arm and leg strength 5/5 equal  Psychiatric:        Mood and Affect: Mood normal.        Speech: Speech normal.        Behavior: Behavior normal. Behavior is cooperative.        Thought Content: Thought content normal.        Judgment: Judgment normal.     TTP olecranon right no ecchymosis or palpable edema/crepitus/foreign body full AROM neck and elbow/shoulder right equal bilateral strength 5/5 hands/arms; on/off exam table without difficulty; gait sure and steady hallway; thermacare on right scapula and TTP centrally right scapula; full internal/external rotation bilateral shoulders negative neers/atchley scratch/empty beer can      Assessment & Plan:  A-neck strain initial, elbow contusion right initial visit and fall from slip/trip initial visit  P-avoid nsaids  motrin/advil/aleve/naproxen/diclofenac/mobic/meloxicam as can raise blood levels valtrex and cause nephrotoxicity per epocrates discussed this with patient and she verbalized understanding.  Tylenol 1000mg  po QID prn pain 8 UD given to patient from clinic stock.  Biofreeze given 4 UD apply topical over painful area QID prn pain.  Home stretches demonstrated to patient-e.g. Arm circles, walking up wall, chest stretches, neck AROM, chin tucks, knee to chest and rock side to side on back. Self massage or professional prn, foam roller use or tennis/racquetball.  Heat/cryotherapy 15 minutes QID prn.  Trial thermacare 1 scapular and 1 cervical applied and another given to patient for use tomorrow from clinic stock.  Consider physical therapy referral if no improvement [facility]or [facility].  Ensure ergonomics correct desk at work avoid repetitive motions if possible/holding phone/laptop in hand use desk/stand and/or break up lifting items into smaller loads/weights.  Patient was instructed to rest, ice, and ROM exercises.  Activity as tolerated.   Follow up if symptoms persist or worsen especially if loss of bowel/bladder control, arm/leg weakness and/or saddle paresthesias.  Exitcare handout on cervical strain, contusion and rehab exercises.  Patient verbalized agreement and understanding of treatment plan and had no further questions at this time.  P2:  Injury Prevention and Fitness.  Patient was instructed to rest, ice and elevate right elbow prn pain/swelling Exitcare handout on elbow contusion.   Medications as directed.  Call or return to clinic as needed if these symptoms worsen or fail to improve as anticipated and will consider imaging/orthopedics eval.  Patient verbalized agreement and understanding of treatment plan.  P2:  ROM, injury prevention

## 2018-12-27 NOTE — Patient Instructions (Addendum)
Contusion  A contusion is a deep bruise. Contusions are the result of a blunt injury to tissues and muscle fibers under the skin. The injury causes bleeding under the skin. The skin overlying the contusion may turn blue, purple, or yellow. Minor injuries will give you a painless contusion, but more severe contusions may stay painful and swollen for a few weeks. What are the causes? This condition is usually caused by a blow, trauma, or direct force to an area of the body. What are the signs or symptoms? Symptoms of this condition include:  Swelling of the injured area.  Pain and tenderness in the injured area.  Discoloration. The area may have redness and then turn blue, purple, or yellow. How is this diagnosed? This condition is diagnosed based on a physical exam and medical history. An X-ray, CT scan, or MRI may be needed to determine if there are any associated injuries, such as broken bones (fractures). How is this treated? Specific treatment for this condition depends on what area of the body was injured. In general, the best treatment for a contusion is resting, icing, applying pressure to (compression), and elevating the injured area. This is often called the RICE strategy. Over-the-counter anti-inflammatory medicines may also be recommended for pain control. Follow these instructions at home:  Rest the injured area.  If directed, apply ice to the injured area: ? Put ice in a plastic bag. ? Place a towel between your skin and the bag. ? Leave the ice on for 20 minutes, 2-3 times per day.  If directed, apply light compression to the injured area using an elastic bandage. Make sure the bandage is not wrapped too tightly. Remove and reapply the bandage as directed by your health care provider.  If possible, raise (elevate) the injured area above the level of your heart while you are sitting or lying down.  Take over-the-counter and prescription medicines only as told by your health  care provider. Contact a health care provider if:  Your symptoms do not improve after several days of treatment.  Your symptoms get worse.  You have difficulty moving the injured area. Get help right away if:  You have severe pain.  You have numbness in a hand or foot.  Your hand or foot turns pale or cold. This information is not intended to replace advice given to you by your health care provider. Make sure you discuss any questions you have with your health care provider. Document Released: 09/02/2005 Document Revised: 04/02/2016 Document Reviewed: 04/10/2015 Elsevier Interactive Patient Education  2019 [employer] Cervical Strain and Sprain Rehab Ask your health care provider which exercises are safe for you. Do exercises exactly as told by your health care provider and adjust them as directed. It is normal to feel mild stretching, pulling, tightness, or discomfort as you do these exercises, but you should stop right away if you feel sudden pain or your pain gets worse.Do not begin these exercises until told by your health care provider. Stretching and range of motion exercises These exercises warm up your muscles and joints and improve the movement and flexibility of your neck. These exercises also help to relieve pain, numbness, and tingling. Exercise A: Cervical side bend  1. Using good posture, sit on a stable chair or stand up. 2. Without moving your shoulders, slowly tilt your left / right ear to your shoulder until you feel a stretch in your neck muscles. You should be looking straight ahead. 3. Hold for _______10___ seconds.  4. Repeat with the other side of your neck. Repeat ____3______ times. Complete this exercise ____3______ times a day. Exercise B: Cervical rotation  1. Using good posture, sit on a stable chair or stand up. 2. Slowly turn your head to the side as if you are looking over your left / right shoulder. ? Keep your eyes level with the ground. ? Stop  when you feel a stretch along the side and the back of your neck. 3. Hold for __10________ seconds. 4. Repeat this by turning to your other side. Repeat _____3_____ times. Complete this exercise ____3______ times a day. Exercise C: Thoracic extension and pectoral stretch 1. Roll a towel or a small blanket so it is about 4 inches (10 cm) in diameter. 2. Lie down on your back on a firm surface. 3. Put the towel lengthwise, under your spine in the middle of your back. It should not be not under your shoulder blades. The towel should line up with your spine from your middle back to your lower back. 4. Put your hands behind your head and let your elbows fall out to your sides. 5. Hold for _____10_____ seconds. Repeat _____3_____ times. Complete this exercise __3________ times a day. Strengthening exercises These exercises build strength and endurance in your neck. Endurance is the ability to use your muscles for a long time, even after your muscles get tired. Exercise D: Upper cervical flexion, isometric 1. Lie on your back with a thin pillow behind your head and a small rolled-up towel under your neck. 2. Gently tuck your chin toward your chest and nod your head down to look toward your feet. Do not lift your head off the pillow. 3. Hold for _____10_____ seconds. 4. Release the tension slowly. Relax your neck muscles completely before you repeat this exercise. Repeat _____3_____ times. Complete this exercise _____3_____ times a day. Exercise E: Cervical extension, isometric  1. Stand about 6 inches (15 cm) away from a wall, with your back facing the wall. 2. Place a soft object, about 6-8 inches (15-20 cm) in diameter, between the back of your head and the wall. A soft object could be a small pillow, a ball, or a folded towel. 3. Gently tilt your head back and press into the soft object. Keep your jaw and forehead relaxed. 4. Hold for ____10_____ seconds. 5. Release the tension slowly. Relax  your neck muscles completely before you repeat this exercise. Repeat ____3______ times. Complete this exercise _____3_____ times a day. Posture and body mechanics Body mechanics refers to the movements and positions of your body while you do your daily activities. Posture is part of body mechanics. Good posture and healthy body mechanics can help to relieve stress in your body's tissues and joints. Good posture means that your spine is in its natural S-curve position (your spine is neutral), your shoulders are pulled back slightly, and your head is not tipped forward. The following are general guidelines for applying improved posture and body mechanics to your everyday activities. Standing   When standing, keep your spine neutral and keep your feet about hip-width apart. Keep a slight bend in your knees. Your ears, shoulders, and hips should line up.  When you do a task in which you stand in one place for a long time, place one foot up on a stable object that is 2-4 inches (5-10 cm) high, such as a footstool. This helps keep your spine neutral. Sitting   When sitting, keep your spine neutral and your  keep feet flat on the floor. Use a footrest, if necessary, and keep your thighs parallel to the floor. Avoid rounding your shoulders, and avoid tilting your head forward.  When working at a desk or a computer, keep your desk at a height where your hands are slightly lower than your elbows. Slide your chair under your desk so you are close enough to maintain good posture.  When working at a computer, place your monitor at a height where you are looking straight ahead and you do not have to tilt your head forward or downward to look at the screen. Resting When lying down and resting, avoid positions that are most painful for you. Try to support your neck in a neutral position. You can use a contour pillow or a small rolled-up towel. Your pillow should support your neck but not push on it. This  information is not intended to replace advice given to you by your health care provider. Make sure you discuss any questions you have with your health care provider. Document Released: 11/23/2005 Document Revised: 07/30/2016 Document Reviewed: 10/30/2015 Else[employer] Interactive Patient Education  2019 [employer] Cervical Sprain  A cervical sprain is a stretch or tear in one or more of the tough, cord-like tissues that connect bones (ligaments) in the neck. Cervical sprains can range from mild to severe. Severe cervical sprains can cause the spinal bones (vertebrae) in the neck to be unstable. This can lead to spinal cord damage and can result in serious nervous system problems. The amount of time that it takes for a cervical sprain to get better depends on the cause and extent of the injury. Most cervical sprains heal in 4-6 weeks. What are the causes? Cervical sprains may be caused by an injury (trauma), such as from a motor vehicle accident, a fall, or sudden forward and backward whipping movement of the head and neck (whiplash injury). Mild cervical sprains may be caused by wear and tear over time, such as from poor posture, sitting in a chair that does not provide support, or looking up or down for long periods of time. What increases the risk? The following factors may make you more likely to develop this condition:  Participating in activities that have a high risk of trauma to the neck. These include contact sports, auto racing, gymnastics, and diving.  Taking risks when driving or riding in a motor vehicle, such as speeding.  Having osteoarthritis of the spine.  Having poor strength and flexibility of the neck.  A previous neck injury.  Having poor posture.  Spending a lot of time in certain positions that put stress on the neck, such as sitting at a computer for long periods of time. What are the signs or symptoms? Symptoms of this condition include:  Pain, soreness, stiffness,  tenderness, swelling, or a burning sensation in the front, back, or sides of the neck.  Sudden tightening of neck muscles that you cannot control (muscle spasms).  Pain in the shoulders or upper back.  Limited ability to move the neck.  Headache.  Dizziness.  Nausea.  Vomiting.  Weakness, numbness, or tingling in a hand or an arm. Symptoms may develop right away after injury, or they may develop over a few days. In some cases, symptoms may go away with treatment and return (recur) over time. How is this diagnosed? This condition may be diagnosed based on:  Your medical history.  Your symptoms.  Any recent injuries or known neck problems that you have,  such as arthritis in the neck.  A physical exam.  Imaging tests, such as: ? X-rays. ? MRI. ? CT scan. How is this treated? This condition is treated by resting and icing the injured area and doing physical therapy exercises. Depending on the severity of your condition, treatment may also include:  Keeping your neck in place (immobilized) for periods of time. This may be done using: ? A cervical collar. This supports your chin and the back of your head. ? A cervical traction device. This is a sling that holds up your head. This removes weight and pressure from your neck, and it may help to relieve pain.  Medicines that help to relieve pain and inflammation.  Medicines that help to relax your muscles (muscle relaxants).  Surgery. This is rare. Follow these instructions at home: If you have a cervical collar:   Wear it as told by your health care provider. Do not remove the collar unless instructed by your health care provider.  Ask your health care provider before you make any adjustments to your collar.  If you have long hair, keep it outside of the collar.  Ask your health care provider if you can remove the collar for cleaning and bathing. If you are allowed to remove the collar for cleaning or bathing: ? Follow  instructions from your health care provider about how to remove the collar safely. ? Clean the collar by wiping it with mild soap and water and drying it completely. ? If your collar has removable pads, remove them every 1-2 days and wash them by hand with soap and water. Let them air-dry completely before you put them back in the collar. ? Check your skin under the collar for irritation or sores. If you see any, tell your health care provider. Managing pain, stiffness, and swelling   If directed, use a cervical traction device as told by your health care provider.  If directed, apply heat to the affected area before you do your physical therapy or as often as told by your health care provider. Use the heat source that your health care provider recommends, such as a moist heat pack or a heating pad. ? Place a towel between your skin and the heat source. ? Leave the heat on for 20-30 minutes. ? Remove the heat if your skin turns bright red. This is especially important if you are unable to feel pain, heat, or cold. You may have a greater risk of getting burned.  If directed, put ice on the affected area: ? Put ice in a plastic bag. ? Place a towel between your skin and the bag. ? Leave the ice on for 20 minutes, 2-3 times a day. Activity  Do not drive while wearing a cervical collar. If you do not have a cervical collar, ask your health care provider if it is safe to drive while your neck heals.  Do not drive or use heavy machinery while taking prescription pain medicine or muscle relaxants, unless your health care provider approves.  Do not lift anything that is heavier than 10 lb (4.5 kg) until your health care provider tells you that it is safe.  Rest as directed by your health care provider. Avoid positions and activities that make your symptoms worse. Ask your health care provider what activities are safe for you.  If physical therapy was prescribed, do exercises as told by your  health care provider or physical therapist. General instructions  Take over-the-counter and prescription medicines  only as told by your health care provider.  Do not use any products that contain nicotine or tobacco, such as cigarettes and e-cigarettes. These can delay healing. If you need help quitting, ask your health care provider.  Keep all follow-up visits as told by your health care provider or physical therapist. This is important. How is this prevented? To prevent a cervical sprain from happening again:  Use and maintain good posture. Make any needed adjustments to your workstation to help you use good posture.  Exercise regularly as directed by your health care provider or physical therapist.  Avoid risky activities that may cause a cervical sprain. Contact a health care provider if:  You have symptoms that get worse or do not get better after 2 weeks of treatment.  You have pain that gets worse or does not get better with medicine.  You develop new, unexplained symptoms.  You have sores or irritated skin on your neck from wearing your cervical collar. Get help right away if:  You have severe pain.  You develop numbness, tingling, or weakness in any part of your body.  You cannot move a part of your body (you have paralysis).  You have neck pain along with: ? Severe dizziness. ? Headache. Summary  A cervical sprain is a stretch or tear in one or more of the tough, cord-like tissues that connect bones (ligaments) in the neck.  Cervical sprains may be caused by an injury (trauma), such as from a motor vehicle accident, a fall, or sudden forward and backward whipping movement of the head and neck (whiplash injury).  Symptoms may develop right away after injury, or they may develop over a few days.  This condition is treated by resting and icing the injured area and doing physical therapy exercises. This information is not intended to replace advice given to you by  your health care provider. Make sure you discuss any questions you have with your health care provider. Document Released: 09/20/2007 Document Revised: 07/22/2016 Document Reviewed: 07/22/2016 Else[employer] Interactive Patient Education  2019 [employer]  Elbow Contusion An elbow contusion is a deep bruise of the elbow. Contusions are the result of a blunt injury to tissues and muscle fibers under the skin. The injury causes bleeding under the skin. The skin overlying the contusion may turn blue, purple, or yellow. Minor injuries will give you a painless contusion, but more severe contusions may stay painful and swollen for a few weeks. What are the causes? Common causes of this condition include:  A hard hit to the elbow.  An injury (trauma) to the elbow.  Direct force on the elbow, such as from a fall. What increases the risk? You are more likely to develop this condition if you:  Play sports or do other physical activities.  Use blood thinners. What are the signs or symptoms? Symptoms of this condition include:  Swelling of the elbow.  Pain and tenderness of the elbow.  Discoloration of the elbow. The area may have redness and then turn blue, purple, or yellow. How is this diagnosed? This condition is diagnosed based on:  Your symptoms and medical history.  A physical exam. You may also need an X-ray to determine if there are any associated injuries, such as broken bones (fractures). An MRI might be done if the swelling and pain do not go away in a few weeks. How is this treated? This condition may be treated with:  Rest, ice, pressure (compression), and elevation. This is often  called RICE therapy. In general, this is considered the best treatment for this condition.  A sling or splint to support your injury.  Over-the-counter anti-inflammatory medicines, such as ibuprofen, for pain control.  Range-of-motion exercises. Follow these instructions at home: RICE  therapy  Rest the injured area.  If directed, put ice on the injured area: ? If you have a removable sling or splint, remove it as told by your health care provider. ? Put ice in a plastic bag. ? Place a towel between your skin and the bag. ? Leave the ice on for 20 minutes, 2-3 times a day.  If directed, apply light compression to the injured area using an elastic bandage. Make sure the bandage is not wrapped too tightly. Remove and reapply the bandage as directed by your health care provider.  Raise (elevate) the injured area above the level of your heart while you are sitting or lying down. If you have a sling or splint:   Wear the sling or splint as told by your health care provider. Remove it only as told by your health care provider.  Loosen the sling or splint if your fingers tingle, become numb, or turn cold and blue.  Keep the sling or splint clean.  If the sling or splint is not waterproof: ? Do not let it get wet. ? Cover it with a watertight covering when you take a bath or a shower. General instructions  Take over-the-counter and prescription medicines only as told by your health care provider.  Return to your normal activities as told by your health care provider. Ask your health care provider what activities are safe for you.  Do range-of-motion exercises only as told by your health care provider.  Ask your health care provider when it is safe to drive if you have a sling or splint on your arm.  Wear elbow pads as told by your health care provider.  Keep all follow-up visits as told by your health care provider. This is important. Contact a health care provider if:  Your symptoms do not improve after several days of treatment.  You have more redness, swelling, or pain in your elbow.  You have difficulty moving the injured area.  Your swelling or pain is not relieved with medicines. Get help right away if:  Your skin over the contusion breaks and starts  bleeding.  You have severe pain.  You have numbness in your hand or fingers.  Your hand or fingers turn pale or cold.  You have swelling of your hand and fingers.  You cannot move your fingers or wrist. Summary  An elbow contusion is a deep bruise of the elbow.  Symptoms include pain, swelling, and discoloration of the elbow.  Rest the injured area and apply ice to the area as told by your health care provider.  If directed, apply light compression to the injured area using an elastic bandage.  Raise (elevate) the injured area above the level of your heart while you are sitting or lying down. This information is not intended to replace advice given to you by your health care provider. Make sure you discuss any questions you have with your health care provider. Document Released: 11/01/2006 Document Revised: 05/26/2018 Document Reviewed: 05/26/2018 [employer] Interactive Patient Education  2019 [employer].

## 2020-12-11 DIAGNOSIS — Z91018 Allergy to other foods: Secondary | ICD-10-CM | POA: Diagnosis not present

## 2020-12-11 DIAGNOSIS — G4700 Insomnia, unspecified: Secondary | ICD-10-CM | POA: Diagnosis not present

## 2020-12-11 DIAGNOSIS — K219 Gastro-esophageal reflux disease without esophagitis: Secondary | ICD-10-CM | POA: Diagnosis not present

## 2020-12-11 DIAGNOSIS — E673 Hypervitaminosis D: Secondary | ICD-10-CM | POA: Diagnosis not present

## 2020-12-11 DIAGNOSIS — E782 Mixed hyperlipidemia: Secondary | ICD-10-CM | POA: Diagnosis not present

## 2020-12-11 DIAGNOSIS — G43119 Migraine with aura, intractable, without status migrainosus: Secondary | ICD-10-CM | POA: Diagnosis not present

## 2020-12-11 DIAGNOSIS — B009 Herpesviral infection, unspecified: Secondary | ICD-10-CM | POA: Diagnosis not present

## 2020-12-11 DIAGNOSIS — I10 Essential (primary) hypertension: Secondary | ICD-10-CM | POA: Diagnosis not present

## 2020-12-11 DIAGNOSIS — M8588 Other specified disorders of bone density and structure, other site: Secondary | ICD-10-CM | POA: Diagnosis not present

## 2020-12-11 DIAGNOSIS — J309 Allergic rhinitis, unspecified: Secondary | ICD-10-CM | POA: Diagnosis not present

## 2020-12-11 DIAGNOSIS — K5289 Other specified noninfective gastroenteritis and colitis: Secondary | ICD-10-CM | POA: Diagnosis not present

## 2020-12-11 DIAGNOSIS — Z79899 Other long term (current) drug therapy: Secondary | ICD-10-CM | POA: Diagnosis not present

## 2020-12-17 DIAGNOSIS — B009 Herpesviral infection, unspecified: Secondary | ICD-10-CM | POA: Diagnosis not present

## 2020-12-17 DIAGNOSIS — I10 Essential (primary) hypertension: Secondary | ICD-10-CM | POA: Diagnosis not present

## 2020-12-17 DIAGNOSIS — K5289 Other specified noninfective gastroenteritis and colitis: Secondary | ICD-10-CM | POA: Diagnosis not present

## 2020-12-17 DIAGNOSIS — G4700 Insomnia, unspecified: Secondary | ICD-10-CM | POA: Diagnosis not present

## 2020-12-17 DIAGNOSIS — M85859 Other specified disorders of bone density and structure, unspecified thigh: Secondary | ICD-10-CM | POA: Diagnosis not present

## 2020-12-17 DIAGNOSIS — K219 Gastro-esophageal reflux disease without esophagitis: Secondary | ICD-10-CM | POA: Diagnosis not present

## 2020-12-17 DIAGNOSIS — Z0000 Encounter for general adult medical examination without abnormal findings: Secondary | ICD-10-CM | POA: Diagnosis not present

## 2020-12-17 DIAGNOSIS — E782 Mixed hyperlipidemia: Secondary | ICD-10-CM | POA: Diagnosis not present

## 2020-12-17 DIAGNOSIS — Z23 Encounter for immunization: Secondary | ICD-10-CM | POA: Diagnosis not present

## 2021-01-10 DIAGNOSIS — M238X1 Other internal derangements of right knee: Secondary | ICD-10-CM | POA: Diagnosis not present

## 2021-01-31 DIAGNOSIS — M25561 Pain in right knee: Secondary | ICD-10-CM | POA: Diagnosis not present

## 2021-02-05 DIAGNOSIS — M2241 Chondromalacia patellae, right knee: Secondary | ICD-10-CM | POA: Diagnosis not present

## 2021-02-05 DIAGNOSIS — S83241A Other tear of medial meniscus, current injury, right knee, initial encounter: Secondary | ICD-10-CM | POA: Diagnosis not present

## 2021-03-24 DIAGNOSIS — X58XXXA Exposure to other specified factors, initial encounter: Secondary | ICD-10-CM | POA: Diagnosis not present

## 2021-03-24 DIAGNOSIS — M2341 Loose body in knee, right knee: Secondary | ICD-10-CM | POA: Diagnosis not present

## 2021-03-24 DIAGNOSIS — M948X6 Other specified disorders of cartilage, lower leg: Secondary | ICD-10-CM | POA: Diagnosis not present

## 2021-03-24 DIAGNOSIS — Y999 Unspecified external cause status: Secondary | ICD-10-CM | POA: Diagnosis not present

## 2021-03-24 DIAGNOSIS — S83231A Complex tear of medial meniscus, current injury, right knee, initial encounter: Secondary | ICD-10-CM | POA: Diagnosis not present

## 2021-03-24 DIAGNOSIS — M94261 Chondromalacia, right knee: Secondary | ICD-10-CM | POA: Diagnosis not present

## 2021-03-24 DIAGNOSIS — G8918 Other acute postprocedural pain: Secondary | ICD-10-CM | POA: Diagnosis not present

## 2021-04-23 DIAGNOSIS — M25561 Pain in right knee: Secondary | ICD-10-CM | POA: Diagnosis not present

## 2021-04-23 DIAGNOSIS — Z4789 Encounter for other orthopedic aftercare: Secondary | ICD-10-CM | POA: Diagnosis not present

## 2021-05-08 DIAGNOSIS — M25561 Pain in right knee: Secondary | ICD-10-CM | POA: Diagnosis not present

## 2021-06-27 DIAGNOSIS — K219 Gastro-esophageal reflux disease without esophagitis: Secondary | ICD-10-CM | POA: Diagnosis not present

## 2021-06-27 DIAGNOSIS — E782 Mixed hyperlipidemia: Secondary | ICD-10-CM | POA: Diagnosis not present

## 2021-06-27 DIAGNOSIS — G4700 Insomnia, unspecified: Secondary | ICD-10-CM | POA: Diagnosis not present

## 2021-06-27 DIAGNOSIS — I10 Essential (primary) hypertension: Secondary | ICD-10-CM | POA: Diagnosis not present

## 2021-06-27 DIAGNOSIS — M189 Osteoarthritis of first carpometacarpal joint, unspecified: Secondary | ICD-10-CM | POA: Diagnosis not present

## 2021-06-30 DIAGNOSIS — B009 Herpesviral infection, unspecified: Secondary | ICD-10-CM | POA: Diagnosis not present

## 2021-06-30 DIAGNOSIS — K219 Gastro-esophageal reflux disease without esophagitis: Secondary | ICD-10-CM | POA: Diagnosis not present

## 2021-06-30 DIAGNOSIS — K5289 Other specified noninfective gastroenteritis and colitis: Secondary | ICD-10-CM | POA: Diagnosis not present

## 2021-06-30 DIAGNOSIS — G4700 Insomnia, unspecified: Secondary | ICD-10-CM | POA: Diagnosis not present

## 2021-06-30 DIAGNOSIS — Z79899 Other long term (current) drug therapy: Secondary | ICD-10-CM | POA: Diagnosis not present

## 2021-06-30 DIAGNOSIS — M85859 Other specified disorders of bone density and structure, unspecified thigh: Secondary | ICD-10-CM | POA: Diagnosis not present

## 2021-06-30 DIAGNOSIS — I10 Essential (primary) hypertension: Secondary | ICD-10-CM | POA: Diagnosis not present

## 2021-06-30 DIAGNOSIS — E782 Mixed hyperlipidemia: Secondary | ICD-10-CM | POA: Diagnosis not present

## 2021-07-25 DIAGNOSIS — T148XXA Other injury of unspecified body region, initial encounter: Secondary | ICD-10-CM | POA: Diagnosis not present

## 2021-07-25 DIAGNOSIS — M79644 Pain in right finger(s): Secondary | ICD-10-CM | POA: Diagnosis not present

## 2021-07-31 DIAGNOSIS — H0288A Meibomian gland dysfunction right eye, upper and lower eyelids: Secondary | ICD-10-CM | POA: Diagnosis not present

## 2021-07-31 DIAGNOSIS — H25041 Posterior subcapsular polar age-related cataract, right eye: Secondary | ICD-10-CM | POA: Diagnosis not present

## 2021-07-31 DIAGNOSIS — H2513 Age-related nuclear cataract, bilateral: Secondary | ICD-10-CM | POA: Diagnosis not present

## 2021-07-31 DIAGNOSIS — H0288B Meibomian gland dysfunction left eye, upper and lower eyelids: Secondary | ICD-10-CM | POA: Diagnosis not present

## 2021-10-05 DIAGNOSIS — K219 Gastro-esophageal reflux disease without esophagitis: Secondary | ICD-10-CM | POA: Diagnosis not present

## 2021-10-05 DIAGNOSIS — M189 Osteoarthritis of first carpometacarpal joint, unspecified: Secondary | ICD-10-CM | POA: Diagnosis not present

## 2021-10-05 DIAGNOSIS — I10 Essential (primary) hypertension: Secondary | ICD-10-CM | POA: Diagnosis not present

## 2021-10-05 DIAGNOSIS — G4700 Insomnia, unspecified: Secondary | ICD-10-CM | POA: Diagnosis not present

## 2021-10-05 DIAGNOSIS — E782 Mixed hyperlipidemia: Secondary | ICD-10-CM | POA: Diagnosis not present

## 2021-11-06 DIAGNOSIS — E782 Mixed hyperlipidemia: Secondary | ICD-10-CM | POA: Diagnosis not present

## 2021-11-06 DIAGNOSIS — K219 Gastro-esophageal reflux disease without esophagitis: Secondary | ICD-10-CM | POA: Diagnosis not present

## 2021-11-06 DIAGNOSIS — G4700 Insomnia, unspecified: Secondary | ICD-10-CM | POA: Diagnosis not present

## 2021-11-06 DIAGNOSIS — I10 Essential (primary) hypertension: Secondary | ICD-10-CM | POA: Diagnosis not present

## 2021-11-06 DIAGNOSIS — M189 Osteoarthritis of first carpometacarpal joint, unspecified: Secondary | ICD-10-CM | POA: Diagnosis not present

## 2021-11-10 DIAGNOSIS — Z03818 Encounter for observation for suspected exposure to other biological agents ruled out: Secondary | ICD-10-CM | POA: Diagnosis not present

## 2021-11-10 DIAGNOSIS — J210 Acute bronchiolitis due to respiratory syncytial virus: Secondary | ICD-10-CM | POA: Diagnosis not present

## 2021-11-10 DIAGNOSIS — R059 Cough, unspecified: Secondary | ICD-10-CM | POA: Diagnosis not present

## 2021-11-10 DIAGNOSIS — Z6826 Body mass index (BMI) 26.0-26.9, adult: Secondary | ICD-10-CM | POA: Diagnosis not present

## 2021-11-10 DIAGNOSIS — J40 Bronchitis, not specified as acute or chronic: Secondary | ICD-10-CM | POA: Diagnosis not present

## 2021-11-10 DIAGNOSIS — R0981 Nasal congestion: Secondary | ICD-10-CM | POA: Diagnosis not present

## 2021-11-17 DIAGNOSIS — Z780 Asymptomatic menopausal state: Secondary | ICD-10-CM | POA: Diagnosis not present

## 2021-11-17 DIAGNOSIS — Z1231 Encounter for screening mammogram for malignant neoplasm of breast: Secondary | ICD-10-CM | POA: Diagnosis not present

## 2021-11-21 DIAGNOSIS — M25522 Pain in left elbow: Secondary | ICD-10-CM | POA: Diagnosis not present

## 2021-11-21 DIAGNOSIS — M7712 Lateral epicondylitis, left elbow: Secondary | ICD-10-CM | POA: Diagnosis not present

## 2021-11-24 DIAGNOSIS — I10 Essential (primary) hypertension: Secondary | ICD-10-CM | POA: Diagnosis not present

## 2021-11-24 DIAGNOSIS — G4700 Insomnia, unspecified: Secondary | ICD-10-CM | POA: Diagnosis not present

## 2021-11-24 DIAGNOSIS — E782 Mixed hyperlipidemia: Secondary | ICD-10-CM | POA: Diagnosis not present

## 2021-11-24 DIAGNOSIS — K5289 Other specified noninfective gastroenteritis and colitis: Secondary | ICD-10-CM | POA: Diagnosis not present

## 2021-11-24 DIAGNOSIS — K219 Gastro-esophageal reflux disease without esophagitis: Secondary | ICD-10-CM | POA: Diagnosis not present

## 2021-11-24 DIAGNOSIS — B338 Other specified viral diseases: Secondary | ICD-10-CM | POA: Diagnosis not present

## 2021-11-24 DIAGNOSIS — B009 Herpesviral infection, unspecified: Secondary | ICD-10-CM | POA: Diagnosis not present

## 2021-11-26 DIAGNOSIS — N6002 Solitary cyst of left breast: Secondary | ICD-10-CM | POA: Diagnosis not present

## 2021-11-26 DIAGNOSIS — R922 Inconclusive mammogram: Secondary | ICD-10-CM | POA: Diagnosis not present

## 2021-11-26 DIAGNOSIS — R928 Other abnormal and inconclusive findings on diagnostic imaging of breast: Secondary | ICD-10-CM | POA: Diagnosis not present

## 2022-01-07 DIAGNOSIS — Z86018 Personal history of other benign neoplasm: Secondary | ICD-10-CM | POA: Diagnosis not present

## 2022-01-07 DIAGNOSIS — Z79899 Other long term (current) drug therapy: Secondary | ICD-10-CM | POA: Diagnosis not present

## 2022-01-07 DIAGNOSIS — R9431 Abnormal electrocardiogram [ECG] [EKG]: Secondary | ICD-10-CM | POA: Diagnosis not present

## 2022-01-07 DIAGNOSIS — K388 Other specified diseases of appendix: Secondary | ICD-10-CM | POA: Diagnosis not present

## 2022-01-07 DIAGNOSIS — K3580 Unspecified acute appendicitis: Secondary | ICD-10-CM | POA: Diagnosis not present

## 2022-01-07 DIAGNOSIS — Z90710 Acquired absence of both cervix and uterus: Secondary | ICD-10-CM | POA: Diagnosis not present

## 2022-01-07 DIAGNOSIS — K353 Acute appendicitis with localized peritonitis, without perforation or gangrene: Secondary | ICD-10-CM | POA: Diagnosis not present

## 2022-01-07 DIAGNOSIS — E7800 Pure hypercholesterolemia, unspecified: Secondary | ICD-10-CM | POA: Diagnosis not present

## 2022-01-07 DIAGNOSIS — D72829 Elevated white blood cell count, unspecified: Secondary | ICD-10-CM | POA: Diagnosis not present

## 2022-01-07 DIAGNOSIS — K3589 Other acute appendicitis without perforation or gangrene: Secondary | ICD-10-CM | POA: Diagnosis not present

## 2022-01-07 DIAGNOSIS — I10 Essential (primary) hypertension: Secondary | ICD-10-CM | POA: Diagnosis not present
